# Patient Record
Sex: Female | Born: 1955
Health system: Southern US, Community
[De-identification: ages and names within clinical notes are randomized; demographics above are authoritative.]

## PROBLEM LIST (undated history)

## (undated) DIAGNOSIS — I10 Essential (primary) hypertension: Secondary | ICD-10-CM

## (undated) DIAGNOSIS — F32A Depression, unspecified: Secondary | ICD-10-CM

## (undated) DIAGNOSIS — F329 Major depressive disorder, single episode, unspecified: Secondary | ICD-10-CM

## (undated) DIAGNOSIS — G43909 Migraine, unspecified, not intractable, without status migrainosus: Secondary | ICD-10-CM

## (undated) DIAGNOSIS — G4700 Insomnia, unspecified: Secondary | ICD-10-CM

## (undated) DIAGNOSIS — R7303 Prediabetes: Secondary | ICD-10-CM

## (undated) DIAGNOSIS — L709 Acne, unspecified: Secondary | ICD-10-CM

## (undated) DIAGNOSIS — E538 Deficiency of other specified B group vitamins: Secondary | ICD-10-CM

## (undated) DIAGNOSIS — E559 Vitamin D deficiency, unspecified: Secondary | ICD-10-CM

## (undated) DIAGNOSIS — F429 Obsessive-compulsive disorder, unspecified: Secondary | ICD-10-CM

## (undated) DIAGNOSIS — E119 Type 2 diabetes mellitus without complications: Secondary | ICD-10-CM

## (undated) DIAGNOSIS — G3184 Mild cognitive impairment, so stated: Secondary | ICD-10-CM

## (undated) DIAGNOSIS — K219 Gastro-esophageal reflux disease without esophagitis: Secondary | ICD-10-CM

## (undated) DIAGNOSIS — F332 Major depressive disorder, recurrent severe without psychotic features: Secondary | ICD-10-CM

## (undated) DIAGNOSIS — E785 Hyperlipidemia, unspecified: Secondary | ICD-10-CM

## (undated) DIAGNOSIS — C4490 Unspecified malignant neoplasm of skin, unspecified: Secondary | ICD-10-CM

## (undated) DIAGNOSIS — M1990 Unspecified osteoarthritis, unspecified site: Secondary | ICD-10-CM

---

## 2006-01-26 ENCOUNTER — Ambulatory Visit: Payer: Self-pay | Admitting: Surgery

## 2006-02-19 ENCOUNTER — Ambulatory Visit: Payer: Self-pay

## 2006-02-25 ENCOUNTER — Ambulatory Visit: Payer: Self-pay

## 2007-04-06 ENCOUNTER — Ambulatory Visit: Payer: Self-pay | Admitting: Family Medicine

## 2009-02-14 ENCOUNTER — Ambulatory Visit: Payer: Self-pay

## 2010-04-30 ENCOUNTER — Ambulatory Visit: Payer: Self-pay

## 2011-07-30 ENCOUNTER — Ambulatory Visit: Payer: Self-pay | Admitting: Family Medicine

## 2011-12-24 ENCOUNTER — Emergency Department: Payer: Self-pay | Admitting: Unknown Physician Specialty

## 2012-03-22 ENCOUNTER — Emergency Department: Payer: Self-pay | Admitting: Emergency Medicine

## 2012-03-22 LAB — COMPREHENSIVE METABOLIC PANEL
Albumin: 3.9 g/dL (ref 3.4–5.0)
Alkaline Phosphatase: 100 U/L (ref 50–136)
BUN: 18 mg/dL (ref 7–18)
Bilirubin,Total: 0.2 mg/dL (ref 0.2–1.0)
EGFR (African American): 53 — ABNORMAL LOW
SGPT (ALT): 57 U/L

## 2012-03-22 LAB — CBC
HCT: 44.1 % (ref 35.0–47.0)
HGB: 14.2 g/dL (ref 12.0–16.0)
MCV: 91 fL (ref 80–100)
Platelet: 226 10*3/uL (ref 150–440)

## 2012-03-22 LAB — URINALYSIS, COMPLETE
Bacteria: NONE SEEN
Bilirubin,UR: NEGATIVE
Blood: NEGATIVE
Ph: 6 (ref 4.5–8.0)
Protein: NEGATIVE
RBC,UR: <1 /HPF (ref 0–5)
Specific Gravity: 1.003 (ref 1.003–1.030)
Squamous Epithelial: NONE SEEN

## 2013-01-26 ENCOUNTER — Ambulatory Visit: Payer: Self-pay

## 2013-01-31 ENCOUNTER — Ambulatory Visit: Payer: Self-pay

## 2014-01-26 ENCOUNTER — Ambulatory Visit: Payer: Self-pay | Admitting: Gastroenterology

## 2014-02-01 ENCOUNTER — Ambulatory Visit: Payer: Self-pay | Admitting: Family Medicine

## 2014-05-26 LAB — COMPREHENSIVE METABOLIC PANEL
ALBUMIN: 4.2 g/dL (ref 3.4–5.0)
ANION GAP: 14 (ref 7–16)
Alkaline Phosphatase: 67 U/L
BILIRUBIN TOTAL: 0.8 mg/dL (ref 0.2–1.0)
BUN: 16 mg/dL (ref 7–18)
Calcium, Total: 8.9 mg/dL (ref 8.5–10.1)
Chloride: 100 mmol/L (ref 98–107)
Co2: 23 mmol/L (ref 21–32)
Creatinine: 1.36 mg/dL — ABNORMAL HIGH (ref 0.60–1.30)
EGFR (African American): 49 — ABNORMAL LOW
EGFR (Non-African Amer.): 42 — ABNORMAL LOW
GLUCOSE: 189 mg/dL — AB (ref 65–99)
OSMOLALITY: 280 (ref 275–301)
Potassium: 2.8 mmol/L — ABNORMAL LOW (ref 3.5–5.1)
SGOT(AST): 43 U/L — ABNORMAL HIGH (ref 15–37)
SGPT (ALT): 45 U/L
Sodium: 137 mmol/L (ref 136–145)
TOTAL PROTEIN: 7.9 g/dL (ref 6.4–8.2)

## 2014-05-26 LAB — SALICYLATE LEVEL

## 2014-05-26 LAB — URINALYSIS, COMPLETE
BILIRUBIN, UR: NEGATIVE
BLOOD: NEGATIVE
Glucose,UR: NEGATIVE mg/dL (ref 0–75)
Ketone: NEGATIVE
Nitrite: NEGATIVE
PH: 6 (ref 4.5–8.0)
Protein: NEGATIVE
SPECIFIC GRAVITY: 1.008 (ref 1.003–1.030)

## 2014-05-26 LAB — DRUG SCREEN, URINE
Amphetamines, Ur Screen: NEGATIVE (ref ?–1000)
Barbiturates, Ur Screen: NEGATIVE (ref ?–200)
Benzodiazepine, Ur Scrn: NEGATIVE (ref ?–200)
CANNABINOID 50 NG, UR ~~LOC~~: NEGATIVE (ref ?–50)
Cocaine Metabolite,Ur ~~LOC~~: NEGATIVE (ref ?–300)
MDMA (Ecstasy)Ur Screen: POSITIVE (ref ?–500)
Methadone, Ur Screen: NEGATIVE (ref ?–300)
OPIATE, UR SCREEN: NEGATIVE (ref ?–300)
Phencyclidine (PCP) Ur S: NEGATIVE (ref ?–25)
Tricyclic, Ur Screen: NEGATIVE (ref ?–1000)

## 2014-05-26 LAB — CBC
HCT: 40.9 % (ref 35.0–47.0)
HGB: 13.4 g/dL (ref 12.0–16.0)
MCH: 30.4 pg (ref 26.0–34.0)
MCHC: 32.8 g/dL (ref 32.0–36.0)
MCV: 93 fL (ref 80–100)
Platelet: 279 10*3/uL (ref 150–440)
RBC: 4.41 10*6/uL (ref 3.80–5.20)
RDW: 13.6 % (ref 11.5–14.5)
WBC: 12.2 10*3/uL — AB (ref 3.6–11.0)

## 2014-05-26 LAB — ACETAMINOPHEN LEVEL: Acetaminophen: <2 ug/mL

## 2014-05-26 LAB — ETHANOL
Ethanol %: <0.003 % (ref 0.000–0.080)
Ethanol: <3 mg/dL

## 2014-05-27 ENCOUNTER — Inpatient Hospital Stay: Payer: Self-pay | Admitting: Psychiatry

## 2015-02-03 NOTE — H&P (Signed)
PATIENT NAME:  Kelsey Arroyo, Kelsey Arroyo MR#:  [id number] DATE OF BIRTH:  03-31-1956  DATE OF ADMISSION:  05/27/2014  PLACE OF DICTATION:  [place], [place], [place].  SEX:  Female.  RACE:  White.  AGE:  59.  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  The patient is divorced wife and currently lives with her mother who is in her 59s and sister.  All 3 of them live in a house.  The patient comes for 1st inpatient hospitalization psychiatry at [facility].  CHIEF COMPLAINT:  "Recently, I've been overwhelmed and not able to think clearly and not able to do stuff."  HISTORY OF PRESENT ILLNESS:  The patient reports that her [place] and she wants to help her, so she has been picking up her grandson and taking him here and there and this got too much on her and she could not deal with it and she started talking about how she feels and her niece and fiance thought that she should come here to hospital for help so that she can talk to somebody and get help as needed.  PAST PSYCHIATRIC HISTORY:  No previous history of inpatient hospital psychiatry.  No history of suicidal attempt, not being followed by any psychiatrist.  FAMILY HISTORY MENTAL ILLNESS:  No known mental illness.  No known no history of suicides in the family.  FAMILY HISTORY:  Raised by mother and family members.  Father was not in the picture as he was killed when the patient was very young.  Has several siblings and tries to be close to them.  PERSONAL HISTORY:  Born in [place], graduated from high school.  No college.    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  MILITARY HISTORY:  None.  MARRIAGES:  Married twice and divorced twice and has 2 sets of twins born 60 years apart, very close to  children who came to visit her today.  ALCOHOL AND DRUGS:  Denies drinking alcohol.  Denies street or prescription drug abuse or IV drug abuse.  Denies smoking nicotine cigarettes.  PAST MEDICAL HISTORY:  Has labile hypertension.  No known diabetes mellitus or high cholesterol.  No history of motor vehicle accident, never been unconscious.  Had colonoscopy done recently being followed by Dr. Gennie Alma, at [facility] outpatient.  Last appointment some time ago.  Next appointment is to be made.  PHYSICAL EXAMINATION: VITAL SIGNS:  Temperature is 98.4, pulse of 80 per minute, regular, respirations 18 per minute regular, blood pressure 138/78 mmHg. HEENT:  Head is normocephalic, atraumatic.  Eyes PERRLA. Fundi benign. NECK:  Supple without any organomegaly.. CHEST:  Normal expansion, normal breath sounds heard. HEART:  Normal S1 without any murmurs or gallops. ABDOMEN:  Soft.  No organomegaly.  Bowel sounds heard. RECTAL/PELVIC: Deferred. NEUROLOGIC:  Gait is normal.  Romberg is negative.  Cranial nerves II-XII grossly intact.  DTRs 2+ normal.    MENTAL STATUS EXAMINATION:  The patient is dressed in hospital scrubs alert and oriented to place, person and time, fully aware of the situation and brought her here for admission.  Affect is flat.  Mood restricted.  Admits feeling stressed out and anxious and overwhelmed dealing with daily chores and having to do the same.  No psychosis, except that she feels  paranoid about some people around, as she does not live in a neighborhood that is very safe and people are not very well to be trusted.  Memory is intact. Cognition is intact.  General fund of knowledge of information is fair.  She could count money.  Memory and recall are good.  Could remember all the 3 objects asked to remember over several minutes.  Knew [place] of [place], name of the current president.  Denies suicidal or homicidal plans and wants to get help and wants  to get better, though at the time of admission, she had wishes of suicide.  Insight and judgment guarded.  Impulse control is poor.  IMPRESSION:   AXIS I:  Major depressive disorder with suicidal ideas, but contracts for safety. AXIS II:  Deferred. AXIS III:  High cholesterol status post colonoscopy. AXIS IV:  Moderate, being overwhelmed and stressed out with doing lots of chores for family members and herself. AXIS V:  Global assessment function 30.  PLAN:  The patient admitted to [facility] for close observation and management for help.  She will be started back on Paxil and the dose will be adjusted.  She will be started on trazodone to help her rest at night.  During the stay in the hospital, she will be given milieu therapy and supportive counseling.  She will take part in individual group therapy where coping skills and dealing with stresses of life will be addressed.  At the time of discharge, the patient stabilized.  Appropriate followup appointment will be made in the community and she will be given counseling on an outpatient basis so that she can cope an ventilate her feelings.    ____________________________ Wallace Cullens. Franchot Mimes, MD skc:DT D: 05/27/2014 18:55:03 ET T: 05/27/2014 19:13:19 ET JOB#: [id number]  cc: Arlyn Leak K. Franchot Mimes, MD, <Dictator> Dewain Penning MD ELECTRONICALLY SIGNED 05/28/2014 16:08

## 2015-02-03 NOTE — Discharge Summary (Signed)
PATIENT NAME:  Kelsey Arroyo, Kelsey Arroyo MR#:  [id number] DATE OF BIRTH:  12/26/1955  DATE OF ADMISSION:  05/27/2014 DATE OF DISCHARGE:  05/29/2014  REASON FOR ADMISSION: Worsening depression related to relational problems with family members.   DISCHARGE DIAGNOSES:  AXIS I: Major depressive disorder, recurrent, moderate.  AXIS II: None.  AXIS III: Hypertension.  AXIS IV: Conflict with mother and daughter.  AXIS V: GAF 50.  DISCHARGE MEDICATIONS: (NO CHANGES FROM HOME MEDICATIONS)  Paxil 60 mg p.o. daily for depression, trazodone 150 mg p.o. at bedtime for insomnia, Ativan 1 mg b.i.d. as needed for anxiety, simvastatin 10 mg p.o. daily for hypercholesterolemia, etodolac 500 mg b.i.d. for hypertension, hydrochloride/triamterene 25 mg/37.5 mg one tab p.o. daily for hypertension, Zantac 150 mg p.o. b.i.d. for GERD.  HOSPITAL COURSE: [name] presented to the Emergency Department willingly on August 15th requesting psychiatric evaluation. The patient reported a past history of depression for which she was prescribed Paxil 60 mg and trazodone 150 mg at bedtime by her primary care provider. The patient wanted help because she has been feeling overwhelmed with her current living arrangement and relationship with her daughter. The patient has been living for years with her mother who is 64 years ago and her sister. She feels unwanted and unappreciated there. She  describes her mother as hypercritical and mean at times. The patient also reports having trouble with one of her daughters as she usually takes care of her grandchildren and great-grandchildren. She states that frequently her daughter feels that she does not do things right and criticizes her frequently.  The patient was admitted to behavioral health for further stabilization and assessment as the patient wanted to be hospitalized due to worsening depression. The patient was maintained on her current medications. No changes were made. The patient was  initially placed on suicide precautions and was observed. Throughout her stay she did well. She denied any suicidality, psychoses. She did not require any seclusion or restraints. This was a short and uneventful hospitalization for this 59 year old female. The patient was advised to follow up with a therapist outpatient and to continue with her current medications as they have been effective for many years.   MENTAL STATUS EXAMINATION: At the time of the discharge, the patient was alert and oriented to person, place, time and situation. She was cooperative. Psychomotor activity was within normal range. Eye contact was good and in normal range. Speech had regular tone, volume, and rate. Thought process was linear and goal directed. Thought content was negative for suicidality or homicidality. Perception was negative for psychosis. Mood euthymic. Affect reactive. Insight and judgment fair.  LABORATORY RESULTS: BUN 16, creatinine 1.36, sodium 137, potassium 2.8. Alcohol was below detection limit. AST 43, ALT 45. Urine toxicology screen was positive for MDMA; however, this is likely to be a false positive. WBC 12.2, hemoglobin 13.4, hematocrit 40.9, platelet count 279,000. UA was clear.   DISCHARGE DISPOSITION: The patient will be returned to her home with her family; however, the patient hopes that in the near future she will be able to move out to her own apartment.   DISCHARGE FOLLOWUP: The patient has been given instructions to follow up with therapist.  List with providers in her network were given.    ____________________________ Hildred Priest, MD ahg:sb D: 05/29/2014 14:17:38 ET T: 05/29/2014 17:04:58 ET JOB#: [id number]  cc: Hildred Priest, MD, <Dictator> Rhodia Albright MD ELECTRONICALLY SIGNED 05/30/2014 14:53

## 2015-06-22 ENCOUNTER — Telehealth: Payer: Self-pay

## 2015-06-22 NOTE — Telephone Encounter (Signed)
Patient lost for appt.  At 10 am this morning.  Called volunteer car to drive to [facility] clinic .  Unable to reach anyone via phone .   Verbal and written directions with contact number given to patient.

## 2016-04-05 ENCOUNTER — Emergency Department: Payer: 59

## 2016-04-05 ENCOUNTER — Emergency Department
Admission: EM | Admit: 2016-04-05 | Discharge: 2016-04-06 | Disposition: A | Discharge status: Home / Self Care | Payer: 59 | Attending: Emergency Medicine | Admitting: Emergency Medicine

## 2016-04-05 ENCOUNTER — Other Ambulatory Visit: Payer: Self-pay

## 2016-04-05 ENCOUNTER — Encounter: Payer: Self-pay | Admitting: *Deleted

## 2016-04-05 DIAGNOSIS — E785 Hyperlipidemia, unspecified: Secondary | ICD-10-CM | POA: Insufficient documentation

## 2016-04-05 DIAGNOSIS — F329 Major depressive disorder, single episode, unspecified: Secondary | ICD-10-CM | POA: Diagnosis not present

## 2016-04-05 DIAGNOSIS — R270 Ataxia, unspecified: Secondary | ICD-10-CM

## 2016-04-05 DIAGNOSIS — E876 Hypokalemia: Secondary | ICD-10-CM | POA: Insufficient documentation

## 2016-04-05 DIAGNOSIS — N390 Urinary tract infection, site not specified: Secondary | ICD-10-CM | POA: Diagnosis not present

## 2016-04-05 DIAGNOSIS — Z85828 Personal history of other malignant neoplasm of skin: Secondary | ICD-10-CM | POA: Diagnosis not present

## 2016-04-05 HISTORY — DX: Unspecified malignant neoplasm of skin, unspecified: C44.90

## 2016-04-05 HISTORY — DX: Hyperlipidemia, unspecified: E78.5

## 2016-04-05 HISTORY — DX: Major depressive disorder, single episode, unspecified: F32.9

## 2016-04-05 HISTORY — DX: Depression, unspecified: F32.A

## 2016-04-05 LAB — BASIC METABOLIC PANEL
Anion gap: 13 (ref 5–15)
BUN: 16 mg/dL (ref 6–20)
CALCIUM: 9.4 mg/dL (ref 8.9–10.3)
CO2: 25 mmol/L (ref 22–32)
CREATININE: 1.64 mg/dL — AB (ref 0.44–1.00)
Chloride: 99 mmol/L — ABNORMAL LOW (ref 101–111)
GFR, EST AFRICAN AMERICAN: 38 mL/min — AB (ref >60)
GFR, EST NON AFRICAN AMERICAN: 33 mL/min — AB (ref >60)
Glucose, Bld: 154 mg/dL — ABNORMAL HIGH (ref 65–99)
Potassium: 2.8 mmol/L — CL (ref 3.5–5.1)
SODIUM: 137 mmol/L (ref 135–145)

## 2016-04-05 LAB — URINALYSIS COMPLETE WITH MICROSCOPIC (ARMC ONLY)
Bilirubin Urine: NEGATIVE
Glucose, UA: NEGATIVE mg/dL
Hgb urine dipstick: NEGATIVE
Ketones, ur: NEGATIVE mg/dL
Nitrite: NEGATIVE
PROTEIN: NEGATIVE mg/dL
SPECIFIC GRAVITY, URINE: 1.01 (ref 1.005–1.030)
pH: 6 (ref 5.0–8.0)

## 2016-04-05 LAB — CBC WITH DIFFERENTIAL/PLATELET
BASOS PCT: 1 %
Basophils Absolute: 0.1 10*3/uL (ref 0–0.1)
EOS ABS: 0.2 10*3/uL (ref 0–0.7)
Eosinophils Relative: 2 %
HCT: 44.8 % (ref 35.0–47.0)
Hemoglobin: 14.9 g/dL (ref 12.0–16.0)
LYMPHS ABS: 2.4 10*3/uL (ref 1.0–3.6)
Lymphocytes Relative: 26 %
MCH: 30 pg (ref 26.0–34.0)
MCHC: 33.4 g/dL (ref 32.0–36.0)
MCV: 89.8 fL (ref 80.0–100.0)
MONOS PCT: 8 %
Monocytes Absolute: 0.8 10*3/uL (ref 0.2–0.9)
NEUTROS ABS: 5.9 10*3/uL (ref 1.4–6.5)
NEUTROS PCT: 63 %
PLATELETS: 262 10*3/uL (ref 150–440)
RBC: 4.99 MIL/uL (ref 3.80–5.20)
RDW: 14.3 % (ref 11.5–14.5)
WBC: 9.3 10*3/uL (ref 3.6–11.0)

## 2016-04-05 LAB — TROPONIN I

## 2016-04-05 MED ORDER — NITROFURANTOIN MONOHYD MACRO 100 MG PO CAPS: 100.0000 mg | ORAL_CAPSULE | Freq: Once | ORAL | Status: DC

## 2016-04-05 MED ORDER — MECLIZINE HCL 25 MG PO TABS
25.0000 mg | ORAL_TABLET | Freq: Once | ORAL | Status: AC
  Administered 2016-04-05: 25 mg via ORAL
  Filled 2016-04-05: qty 1

## 2016-04-05 MED ORDER — CEPHALEXIN 500 MG PO CAPS: 500.0000 mg | ORAL_CAPSULE | Freq: Three times a day (TID) | ORAL | Status: AC

## 2016-04-05 MED ORDER — POTASSIUM CHLORIDE ER 10 MEQ PO TBCR: 40.0000 meq | EXTENDED_RELEASE_TABLET | Freq: Every day | ORAL | Status: DC

## 2016-04-05 MED ORDER — POTASSIUM CHLORIDE CRYS ER 20 MEQ PO TBCR
40.0000 meq | EXTENDED_RELEASE_TABLET | Freq: Once | ORAL | Status: AC
  Administered 2016-04-05: 40 meq via ORAL
  Filled 2016-04-05: qty 2

## 2016-04-05 MED ORDER — NITROFURANTOIN MONOHYD MACRO 100 MG PO CAPS: 100.0000 mg | ORAL_CAPSULE | Freq: Two times a day (BID) | ORAL | Status: DC

## 2016-04-05 MED ORDER — SODIUM CHLORIDE 0.9 % IV BOLUS (SEPSIS)
1000.0000 mL | Freq: Once | INTRAVENOUS | Status: AC
  Administered 2016-04-05: 1000 mL via INTRAVENOUS

## 2016-04-05 MED ORDER — POTASSIUM CHLORIDE ER 10 MEQ PO TBCR: 40.0000 meq | EXTENDED_RELEASE_TABLET | Freq: Once | ORAL | Status: DC

## 2016-04-05 MED ORDER — CEPHALEXIN 500 MG PO CAPS
500.0000 mg | ORAL_CAPSULE | Freq: Once | ORAL | Status: AC
  Administered 2016-04-06: 500 mg via ORAL
  Filled 2016-04-05: qty 1

## 2016-04-05 NOTE — ED Notes (Signed)
Pt. States she makes herself vomit sometimes when she thinks she is getting sick.  Pt. States she vomited one time this week, in which she did not force herself to vomit.

## 2016-04-05 NOTE — ED Notes (Signed)
Patient was brought to the hospital by co-workers due to confusion, dizziness and being off-balance. Co-workers stated that the patient couldn't find her car in the parking lot. Patient states she has been grieving for the loss of a friend and has been depressed for the last two weeks, and that she frequently can't find her car in the parking lot. Patient states that she has been intermittently dizzy and off-balance when ambulating, but has a history of vertigo. Patient states she lives with her mother who hates her and her sisters hate her. Patient is alert and oriented x4, clear speech, moves all extremities equally. Patient is currently in a wheelchair in triage, but states she is not dizzy at this time.

## 2016-04-05 NOTE — ED Provider Notes (Signed)
[facility] Emergency Department Provider Note   ____________________________________________  Time seen: Approximately [id number] PM  I have reviewed the triage vital signs and the nursing notes.   HISTORY  Chief Complaint Dizziness   HPI Kelsey Arroyo is a 60 y.o. female with a history of depression who is presenting to the emergency department with feeling unsteady on her feet as well as intermittent left hand numbness over the past week. She says that she has been stressed because of her home situation living with her mother who is she argues with frequently. Says that she also lost a family member recently she said was her friend. She denies any suicidal or homicidal ideation. Is not sure if the stress is contributing to her current symptoms. Says that she has been forgetful about finding her car but this is not a new thing for her. Has not taken anything for her vertigo or unsteadiness on her feet. Says that this is only an intermittent feeling and that she does not have any when she is lying down at this time. Does not have any ringing or roaring in her ears. No pressure to her's bilaterally. No numbness or weakness at this time. Denies any chest pain or shortness of breath.   Past Medical History  Diagnosis Date  . Depression   . Skin cancer   . Hyperlipemia     There are no active problems to display for this patient.   Past Surgical History  Procedure Laterality Date  . Cesarean section      x2    No current outpatient prescriptions on file.  Allergies Review of patient's allergies indicates no known allergies.  No family history on file.  Social History Social History  Substance Use Topics  . Smoking status: Never Smoker   . Smokeless tobacco: None  . Alcohol Use: No    Review of Systems Constitutional: No fever/chills Eyes: No visual changes. ENT: No sore throat. Cardiovascular: Denies chest pain. Respiratory: Denies shortness  of breath. Gastrointestinal: No abdominal pain.  No nausea, no vomiting.  No diarrhea.  No constipation. Genitourinary: Negative for dysuria. Musculoskeletal: Negative for back pain. Skin: Negative for rash. Neurological: Negative for headaches, focal weakness  10-point ROS otherwise negative.  ____________________________________________   PHYSICAL EXAM:  VITAL SIGNS: ED Triage Vitals  Enc Vitals Group     BP 04/05/16 1859 127/83 mmHg     Pulse Rate 04/05/16 1859 109     Resp 04/05/16 1859 18     Temp 04/05/16 1859 98 F (36.7 C)     Temp Source 04/05/16 1859 Oral     SpO2 04/05/16 1859 96 %     Weight 04/05/16 1859 145 lb (65.772 kg)     Height 04/05/16 1859 4\' 9"  (1.448 m)     Head Cir --      Peak Flow --      Pain Score --      Pain Loc --      Pain Edu? --      Excl. in [place]? --     Constitutional: Alert and oriented. Well appearing and in no acute distress. Eyes: Conjunctivae are normal. PERRL. EOMI.No nystagmus. ead: Atraumatic.Tympanic membranes are normal bilaterally. Nose: No congestion/rhinnorhea. Mouth/Throat: Mucous membranes are moist.  Oropharynx non-erythematous. Neck: No stridor.   Cardiovascular: Normal rate, regular rhythm. Grossly normal heart sounds.  Good peripheral circulation with equal and bilateral radial pulses. Respiratory: Normal respiratory effort.  No retractions. Lungs CTAB. Gastrointestinal:  Soft and nontender. No distention. No abdominal bruits. No CVA tenderness. Musculoskeletal: No lower extremity tenderness nor edema.  No joint effusions. Neurologic:  Normal speech and language. No gross focal neurologic deficits are appreciated. No ataxia on finger to nose testing. However, when the patient gets up and tries to walk she becomes unsteady on her feet. She is able to ambulate but with an ataxic gait. Skin:  Skin is warm, dry and intact. No rash noted. Psychiatric: Mood and affect are normal. Speech and behavior are  normal.  ____________________________________________   LABS (all labs ordered are listed, but only abnormal results are displayed)  Labs Reviewed  BASIC METABOLIC PANEL - Abnormal; Notable for the following:    Potassium 2.8 (*)    Chloride 99 (*)    Glucose, Bld 154 (*)    Creatinine, Ser 1.64 (*)    GFR calc non Af Amer 33 (*)    GFR calc Af Amer 38 (*)    All other components within normal limits  URINALYSIS COMPLETEWITH MICROSCOPIC ([facility] ONLY) - Abnormal; Notable for the following:    Color, Urine YELLOW (*)    APPearance CLOUDY (*)    Leukocytes, UA 3+ (*)    Bacteria, UA RARE (*)    Squamous Epithelial / LPF 0-5 (*)    All other components within normal limits  URINE CULTURE  CBC WITH DIFFERENTIAL/PLATELET  TROPONIN I   ____________________________________________  EKG  ED ECG REPORT I, Doran Stabler, the attending physician, personally viewed and interpreted this ECG.   Date: 04/05/2016  EKG Time: 2125  Rate: 94  Rhythm: normal sinus rhythm  Axis: Right axis deviation  Intervals:none  ST&T Change: No ST segment elevation or depression. Biphasic T waves to the precordial leads.  ____________________________________________  RADIOLOGY   DG Chest 2 View (Final result) Result time: 04/05/16 21:41:26   Final result by Rad Results In Interface (04/05/16 21:41:26)   Narrative:   CLINICAL DATA: Acute onset of dizziness and cough. Initial encounter.  EXAM: CHEST 2 VIEW  COMPARISON: None.  FINDINGS: The lungs are well-aerated. Peribronchial thickening is seen. There is no evidence of focal opacification, pleural effusion or pneumothorax.  The heart is normal in size; the mediastinal contour is within normal limits. No acute osseous abnormalities are seen.  IMPRESSION: Peribronchial thickening noted. Lungs otherwise grossly clear.   Electronically Signed By: Garald Balding M.D. On: 04/05/2016 21:41          CT Head Wo  Contrast (Final result) Result time: 04/05/16 [id number]   Final result by Rad Results In Interface (04/05/16 21:34:22)   Narrative:   CLINICAL DATA: 60 year old female with a history of mental distress  EXAM: CT HEAD WITHOUT CONTRAST  TECHNIQUE: Contiguous axial images were obtained from the base of the skull through the vertex without intravenous contrast.  COMPARISON: 03/22/2012  FINDINGS: Unremarkable appearance of the calvarium without acute fracture or aggressive lesion.  Unremarkable appearance of the scalp soft tissues.  Unremarkable appearance of the bilateral orbits.  Mastoid air cells are clear.  No significant paranasal sinus disease  No acute intracranial hemorrhage, midline shift, or mass effect.  Gray-white differentiation is maintained, without CT evidence of acute ischemia.  Unremarkable configuration of the ventricles.  Mild brain volume loss, similar to the comparison.  IMPRESSION: No CT evidence of acute intracranial abnormality.  Similar appearance of parenchymal atrophy.  Signed,  Dulcy Fanny. Earleen Newport, DO  [facility]  [facility]   Electronically Signed By:  Corrie Mckusick D.O. On: 04/05/2016 21:34    ____________________________________________   PROCEDURES   ____________________________________________   INITIAL IMPRESSION / ASSESSMENT AND PLAN / ED COURSE  Pertinent labs & imaging results that were available during my care of the patient were reviewed by me and considered in my medical decision making (see chart for details).  ----------------------------------------- 11:39 PM on 04/05/2016 -----------------------------------------  Patient is resting comfortably at this time. Able to ambulate with some improvement in her gait. Checked her heel to shin movements at this time and there is no ataxic movement. Able to walk unassisted walking in a straight line but difficulty  with getting in and out of bed. Unclear Cause of the patient's symptoms. We'll treat her hypokalemia as well as UTI. Patient is not experiencing any spinning sensation of the room and wasn't prior to arrival. Says that she just feels unsteady in her feet. Will not discharge with meclizine. She'll be following up with her primary care doctor for further evaluation. She does mention again that she has been taking the family lost particularly hard over the past 2 weeks. She says that she was given several references for counseling follow-up from her cardiologist which she plans to pursue. She continues to not report any suicidal or homicidal ideation. I feel she is safe for outpatient treatment at this time. We reviewed her labs as well as imaging and she understands plan and is willing to comply. ____________________________________________   FINAL CLINICAL IMPRESSION(S) / ED DIAGNOSES  Hypokalemia. Ataxia. Urinary tract infection.    NEW MEDICATIONS STARTED DURING THIS VISIT:  New Prescriptions   No medications on file     Note:  This document was prepared using Dragon voice recognition software and may include unintentional dictation errors.    Orbie Pyo, MD 04/05/16 [phone]

## 2016-04-05 NOTE — ED Notes (Signed)
URINE COLLECTED AND SENT  LM EDT

## 2016-04-05 NOTE — ED Notes (Signed)
Pt. Indicated that she has been having some family living arrangement problems.  Pt. States she has been feeling dizzy for the past week worse tonight.

## 2016-04-05 NOTE — ED Notes (Signed)
Lab called to report 2.8 K level, Dr. Archie Balboa advised.

## 2016-04-06 NOTE — ED Notes (Signed)
Pt. Going home with family. 

## 2016-04-07 LAB — URINE CULTURE

## 2016-06-11 ENCOUNTER — Ambulatory Visit
Admission: RE | Admit: 2016-06-11 | Discharge: 2016-06-11 | Disposition: A | Discharge status: Home / Self Care | Payer: Self-pay | Source: Ambulatory Visit | Attending: Oncology | Admitting: Oncology

## 2016-06-11 ENCOUNTER — Ambulatory Visit: Payer: 59 | Attending: Oncology

## 2016-06-11 VITALS — BP 114/77 | HR 85 | Temp 97.9°F | Ht 59.45 in | Wt 153.6 lb

## 2016-06-11 DIAGNOSIS — Z0000 Encounter for general adult medical examination without abnormal findings: Secondary | ICD-10-CM

## 2016-06-11 NOTE — Progress Notes (Signed)
Subjective:     Patient ID: Kelsey Arroyo, female   DOB: 1955-11-25, 60 y.o.   MRN: [id number]  HPI   Review of Systems     Objective:   Physical Exam  Pulmonary/Chest: Right breast exhibits no inverted nipple, no mass, no nipple discharge, no skin change and no tenderness. Left breast exhibits no inverted nipple, no mass, no nipple discharge, no skin change and no tenderness. Breasts are symmetrical.  Genitourinary: No labial fusion. There is no rash, tenderness, lesion or injury on the right labia. There is no rash, tenderness, lesion or injury on the left labia. No erythema, tenderness or bleeding in the vagina. No foreign body in the vagina. No signs of injury around the vagina. No vaginal discharge found.  Genitourinary Comments: Patient had hysterectomy.  No cervix visualized or palpated.  Unable to palpate ovaries.       Assessment:     60 year old patient presents for [facility] clinic visitPatient screened, and meets B[facility]CP eligibility.  Patient does not have insurance, Medicare or Medicaid.  Handout given on Affordable Care Act.Patient screened, and meets B[facility]CP eligibility.  Patient does not have insurance, Medicare or Medicaid.  Handout given on Affordable Care Act.CBE unremarkable.  Ptient has history of  Hysterectomy. Unsure if partial or total.  No cervix visualized.  Not able to palpate ovaries.  Last pap in 2014 was ASCUS HPV positive.     Plan:     Sent for bilateral screening mammogram.  Specimen collected for pap.

## 2016-06-17 LAB — PAP LB AND HPV HIGH-RISK
HPV, high-risk: POSITIVE — AB
PAP SMEAR COMMENT: 0

## 2016-06-18 NOTE — Progress Notes (Signed)
Phoned patient with normal mammogram results, and negative/positive papa results.  Explained to patient that she will have a repeat pap smear with her annual visit in 2018 due to positive HPV results.  Copy to [facility].

## 2016-06-25 NOTE — Progress Notes (Signed)
Sent message to [employer], who followed patient in 2014 for pap results of ASCUS/positive for follow-up pap recommendations.  Patient has a history of a hysterectomy with  bilateral salpingo/oopherectomy in 2002.

## 2016-06-27 NOTE — Progress Notes (Addendum)
Per Melody Shambley CNMW from [facility], patient is to have repeat pap on annual exam related to positive HPV result on pap performed 06/11/16.

## 2016-08-26 ENCOUNTER — Emergency Department: Payer: 59

## 2016-08-26 ENCOUNTER — Emergency Department
Admission: EM | Admit: 2016-08-26 | Discharge: 2016-08-26 | Disposition: A | Discharge status: Home / Self Care | Payer: 59 | Attending: Emergency Medicine | Admitting: Emergency Medicine

## 2016-08-26 DIAGNOSIS — Z85828 Personal history of other malignant neoplasm of skin: Secondary | ICD-10-CM | POA: Insufficient documentation

## 2016-08-26 DIAGNOSIS — W1809XA Striking against other object with subsequent fall, initial encounter: Secondary | ICD-10-CM | POA: Insufficient documentation

## 2016-08-26 DIAGNOSIS — Y999 Unspecified external cause status: Secondary | ICD-10-CM | POA: Diagnosis not present

## 2016-08-26 DIAGNOSIS — Y929 Unspecified place or not applicable: Secondary | ICD-10-CM | POA: Insufficient documentation

## 2016-08-26 DIAGNOSIS — S0081XA Abrasion of other part of head, initial encounter: Secondary | ICD-10-CM

## 2016-08-26 DIAGNOSIS — S0990XA Unspecified injury of head, initial encounter: Secondary | ICD-10-CM | POA: Diagnosis present

## 2016-08-26 DIAGNOSIS — Z23 Encounter for immunization: Secondary | ICD-10-CM | POA: Diagnosis not present

## 2016-08-26 DIAGNOSIS — I10 Essential (primary) hypertension: Secondary | ICD-10-CM | POA: Diagnosis not present

## 2016-08-26 DIAGNOSIS — S0083XA Contusion of other part of head, initial encounter: Secondary | ICD-10-CM | POA: Insufficient documentation

## 2016-08-26 DIAGNOSIS — Y9389 Activity, other specified: Secondary | ICD-10-CM | POA: Insufficient documentation

## 2016-08-26 DIAGNOSIS — W19XXXA Unspecified fall, initial encounter: Secondary | ICD-10-CM

## 2016-08-26 HISTORY — DX: Essential (primary) hypertension: I10

## 2016-08-26 LAB — BASIC METABOLIC PANEL
ANION GAP: 11 (ref 5–15)
BUN: 15 mg/dL (ref 6–20)
CALCIUM: 8.9 mg/dL (ref 8.9–10.3)
CO2: 24 mmol/L (ref 22–32)
Chloride: 102 mmol/L (ref 101–111)
Creatinine, Ser: 1.09 mg/dL — ABNORMAL HIGH (ref 0.44–1.00)
GFR calc Af Amer: >60 mL/min (ref >60)
GFR, EST NON AFRICAN AMERICAN: 53 mL/min — AB (ref >60)
GLUCOSE: 100 mg/dL — AB (ref 65–99)
Potassium: 3.6 mmol/L (ref 3.5–5.1)
SODIUM: 137 mmol/L (ref 135–145)

## 2016-08-26 LAB — CBC WITH DIFFERENTIAL/PLATELET
BASOS ABS: 0.1 10*3/uL (ref 0–0.1)
BASOS PCT: 1 %
EOS PCT: 5 %
Eosinophils Absolute: 0.3 10*3/uL (ref 0–0.7)
HEMATOCRIT: 43.2 % (ref 35.0–47.0)
Hemoglobin: 14.3 g/dL (ref 12.0–16.0)
Lymphocytes Relative: 31 %
Lymphs Abs: 2.2 10*3/uL (ref 1.0–3.6)
MCH: 30 pg (ref 26.0–34.0)
MCHC: 33.2 g/dL (ref 32.0–36.0)
MCV: 90.4 fL (ref 80.0–100.0)
MONO ABS: 0.5 10*3/uL (ref 0.2–0.9)
Monocytes Relative: 7 %
NEUTROS ABS: 4 10*3/uL (ref 1.4–6.5)
Neutrophils Relative %: 56 %
PLATELETS: 205 10*3/uL (ref 150–440)
RBC: 4.78 MIL/uL (ref 3.80–5.20)
RDW: 13.7 % (ref 11.5–14.5)
WBC: 7 10*3/uL (ref 3.6–11.0)

## 2016-08-26 LAB — URINALYSIS COMPLETE WITH MICROSCOPIC (ARMC ONLY)
BILIRUBIN URINE: NEGATIVE
Bacteria, UA: NONE SEEN
Glucose, UA: NEGATIVE mg/dL
HGB URINE DIPSTICK: NEGATIVE
KETONES UR: NEGATIVE mg/dL
LEUKOCYTES UA: NEGATIVE
NITRITE: NEGATIVE
PH: 7 (ref 5.0–8.0)
Protein, ur: NEGATIVE mg/dL
Specific Gravity, Urine: 1.008 (ref 1.005–1.030)

## 2016-08-26 LAB — TROPONIN I: Troponin I: <0.03 ng/mL (ref <0.03)

## 2016-08-26 MED ORDER — TETANUS-DIPHTH-ACELL PERTUSSIS 5-2.5-18.5 LF-MCG/0.5 IM SUSP
0.5000 mL | Freq: Once | INTRAMUSCULAR | Status: AC
Start: 2016-08-26 — End: 2016-08-26
  Administered 2016-08-26: 0.5 mL via INTRAMUSCULAR
  Filled 2016-08-26: qty 0.5

## 2016-08-26 NOTE — ED Notes (Signed)
Hooked pt up to dinamap

## 2016-08-26 NOTE — ED Notes (Signed)
Pt tearful and wanted RN to call mom to tell Olivia Mackie to pick up child at 2. RN notified.

## 2016-08-26 NOTE — ED Notes (Signed)
Per Dr Reita Cliche awaiting add on troponin level before the pt can be discharged

## 2016-08-26 NOTE — ED Triage Notes (Signed)
Pt arrived via ems for a fall today - pt was on a field trip with grandchild and when she went to find her car she became confused and disoriented - then she fell face first onto the ground causing a hematoma to forehead and abrasion to left knee - pt states that she has had periods of confusion off and on for months and a history of falls (3-4 falls in the last 3 months) - pt is A&O x4 at this time - respirations even and unlabored

## 2016-08-26 NOTE — ED Notes (Signed)
Patient transported to CT 

## 2016-08-26 NOTE — ED Notes (Signed)
Pt arrived via ems for a fall today - pt was on a field trip with grandchild and when she went to find her car she became confused and disoriented - then she fell face first onto the ground causing a hematoma to forehead and abrasion to left knee - pt states that she has had periods of confusion off and on for months and a history of falls (3-4 falls in the last 3 months) - pt is A&O x4 at this time - respirations even and unlabored - CBG 99

## 2016-08-26 NOTE — Discharge Instructions (Signed)
You were evaluated after fall, no serious injury is suspected.  Your examine evaluation are reassuring in the emergency department today.  Return to the emergency room for any new or worsening condition including any neurologic symptoms such as worsening headache, confusion, altered mental status, speech problems, dizziness or passing out, weakness or numbness, or any other symptoms concerning to you.

## 2016-08-26 NOTE — ED Provider Notes (Signed)
[facility] Emergency Department Provider Note ____________________________________________   I have reviewed the triage vital signs and the triage nursing note.  HISTORY  Chief Complaint Fall and Altered Mental Status   Historian Patient  HPI Kelsey Arroyo is a 60 y.o. female brought in by EMS after a fall. Patient states that she thinks that she just lost her balance and landed striking her head and forehead. She has a mild headache. Denies neck pain. Denies extremity injury other than a scrape to her left knee. She has been able to walk after the fall. She thinks that over the past 2 weeks she has had some mild increase in some baseline off balance issues. Denies focal weakness or numbness.  No chest pain or trouble breathing.  Off balance she considers mild to moderate, headache pain is mild.  Unclear when her last tetanus shot was.    Past Medical History:  Diagnosis Date  . Depression   . Hyperlipemia   . Hypertension   . Skin cancer     There are no active problems to display for this patient.   Past Surgical History:  Procedure Laterality Date  . CESAREAN SECTION     x2    Prior to Admission medications   Medication Sig Start Date End Date Taking? Authorizing Provider  potassium chloride (K-DUR) 10 MEQ tablet Take 4 tablets (40 mEq total) by mouth daily. 04/05/16   Orbie Pyo, MD    No Known Allergies  No family history on file.  Social History Social History  Substance Use Topics  . Smoking status: Never Smoker  . Smokeless tobacco: Never Used  . Alcohol use No    Review of Systems  Constitutional: Negative for fever. Eyes: Negative for visual changes. ENT: Negative for sore throat. Cardiovascular: Negative for chest pain. Respiratory: Negative for shortness of breath. Gastrointestinal: Negative for abdominal pain, vomiting and diarrhea. Genitourinary: Negative for dysuria. Musculoskeletal: Negative for  back pain. Skin: Negative for rash. Neurological: Positive for mild headache. 10 point Review of Systems otherwise negative ____________________________________________   PHYSICAL EXAM:  VITAL SIGNS: ED Triage Vitals  Enc Vitals Group     BP 08/26/16 1140 110/78     Pulse Rate 08/26/16 1140 70     Resp 08/26/16 1140 16     Temp 08/26/16 1140 97.7 F (36.5 C)     Temp src --      SpO2 08/26/16 1140 98 %     Weight 08/26/16 1142 150 lb (68 kg)     Height 08/26/16 1142 5' (1.524 m)     Head Circumference --      Peak Flow --      Pain Score 08/26/16 1142 0     Pain Loc --      Pain Edu? --      Excl. in [place]? --      Constitutional: Alert and oriented. Well appearing and in no distress. HEENT   Head: Normocephalic.  Ecchymosis and abrasions to her forehead, and nose and chin.      Eyes: Conjunctivae are normal. PERRL. Normal extraocular movements.      Ears:         Nose: No congestion/rhinnorhea. No nasal septal hematoma. No nasal bridge tenderness.   Mouth/Throat: Mucous membranes are mildly dry.  No chipped teeth.   Neck: No stridor.  No midline cervical spine tenderness to palpation or range of motion. Cardiovascular/Chest: Normal rate, regular rhythm.  No murmurs, rubs, or  gallops. Respiratory: Normal respiratory effort without tachypnea nor retractions. Breath sounds are clear and equal bilaterally. No wheezes/rales/rhonchi. Gastrointestinal: Soft. No distention, no guarding, no rebound. Nontender.    Genitourinary/rectal:Deferred Musculoskeletal: Nontender with normal range of motion in all extremities. No joint effusions.  No lower extremity tenderness.  No edema.  Tiny abrasion to the left knee. Neurologic:  Normal speech and language. No slurred speech. No aphasia. No gross or focal neurologic deficits are appreciated.  Coordination intact. Skin: Few abrasions to the face and left knee. Psychiatric: Mood and affect are normal. Speech and behavior are normal.  Patient exhibits appropriate insight and judgment.   ____________________________________________  LABS (pertinent positives/negatives)  Labs Reviewed  BASIC METABOLIC PANEL - Abnormal; Notable for the following:       Result Value   Glucose, Bld 100 (*)    Creatinine, Ser 1.09 (*)    GFR calc non Af Amer 53 (*)    All other components within normal limits  URINALYSIS COMPLETEWITH MICROSCOPIC ([facility] ONLY) - Abnormal; Notable for the following:    Color, Urine YELLOW (*)    APPearance CLEAR (*)    Squamous Epithelial / LPF 0-5 (*)    All other components within normal limits  CBC WITH DIFFERENTIAL/PLATELET  TROPONIN I    ____________________________________________   EKG I, Lisa Roca, MD, the attending physician have personally viewed and interpreted all ECGs.  70 bpm. Normal sinus rhythm. Narrow QRS. Normal axis. Nonspecific ST and T-wave ____________________________________________  RADIOLOGY All Xrays were viewed by me. Imaging interpreted by Radiologist.  Ct head without contrast:   CLINICAL DATA: Fall with head injury.  EXAM: CT HEAD WITHOUT CONTRAST  TECHNIQUE: Contiguous axial images were obtained from the base of the skull through the vertex without intravenous contrast.  COMPARISON: 04/05/2016 head CT.  FINDINGS: Brain: No evidence of parenchymal hemorrhage or extra-axial fluid collection. No mass lesion, mass effect, or midline shift. No CT evidence of acute infarction. Intracranial atherosclerosis. Nonspecific minimal subcortical and periventricular white matter hypodensity, most in keeping with chronic small vessel ischemic change. Mild generalized cerebral volume loss. No ventriculomegaly.  Vascular: No hyperdense vessel or unexpected calcification.  Skull: No evidence of calvarial fracture.  Sinuses/Orbits: The visualized paranasal sinuses are essentially clear.  Other: The mastoid air cells are unopacified.  IMPRESSION: 1. No  evidence of acute intracranial abnormality. No evidence of calvarial fracture. 2. Mild generalized cerebral volume loss and minimal chronic small vessel ischemia.      __________________________________________  PROCEDURES  Procedure(s) performed: None  Critical Care performed: None  ____________________________________________   ED COURSE / ASSESSMENT AND PLAN  Pertinent labs & imaging results that were available during my care of the patient were reviewed by me and considered in my medical decision making (see chart for details).   Laboratory examination reassuring for no indication of medical complication contributing to fall  - based on patient history she states that she just kind of lost her balance and thinks maybe as She hadn't had anything to eat this morning.  CT the head shows no traumatic findings, nor acute cause for her reporting some being off balance over the last week or so.  Tetanus status was updated.  [place] for discharge home and follow up with PCP.   CONSULTATIONS:  None  Patient / Family / Caregiver informed of clinical course, medical decision-making process, and agree with plan.   I discussed return precautions, follow-up instructions, and discharge instructions with patient and/or family.   ___________________________________________  FINAL CLINICAL IMPRESSION(S) / ED DIAGNOSES   Final diagnoses:  Injury of head, initial encounter  Facial abrasion, initial encounter  Fall, initial encounter              Note: This dictation was prepared with Dragon dictation. Any transcriptional errors that result from this process are unintentional    Lisa Roca, MD 08/26/16 [phone]

## 2016-10-15 ENCOUNTER — Encounter: Payer: Self-pay | Admitting: *Deleted

## 2016-10-15 ENCOUNTER — Emergency Department
Admission: EM | Admit: 2016-10-15 | Discharge: 2016-10-16 | Disposition: A | Discharge status: Other Acute Inpatient Hospital | Payer: 59 | Attending: Emergency Medicine | Admitting: Emergency Medicine

## 2016-10-15 DIAGNOSIS — F418 Other specified anxiety disorders: Secondary | ICD-10-CM | POA: Diagnosis not present

## 2016-10-15 DIAGNOSIS — F332 Major depressive disorder, recurrent severe without psychotic features: Secondary | ICD-10-CM | POA: Diagnosis not present

## 2016-10-15 DIAGNOSIS — Z79899 Other long term (current) drug therapy: Secondary | ICD-10-CM | POA: Diagnosis not present

## 2016-10-15 DIAGNOSIS — Z85828 Personal history of other malignant neoplasm of skin: Secondary | ICD-10-CM | POA: Insufficient documentation

## 2016-10-15 DIAGNOSIS — I10 Essential (primary) hypertension: Secondary | ICD-10-CM | POA: Diagnosis not present

## 2016-10-15 DIAGNOSIS — F32A Depression, unspecified: Secondary | ICD-10-CM

## 2016-10-15 DIAGNOSIS — F329 Major depressive disorder, single episode, unspecified: Secondary | ICD-10-CM

## 2016-10-15 DIAGNOSIS — F419 Anxiety disorder, unspecified: Secondary | ICD-10-CM

## 2016-10-15 LAB — URINALYSIS, ROUTINE W REFLEX MICROSCOPIC
BACTERIA UA: NONE SEEN
Bilirubin Urine: NEGATIVE
Glucose, UA: NEGATIVE mg/dL
HGB URINE DIPSTICK: NEGATIVE
KETONES UR: 80 mg/dL — AB
Nitrite: NEGATIVE
PROTEIN: 100 mg/dL — AB
Specific Gravity, Urine: 1.021 (ref 1.005–1.030)
pH: 5 (ref 5.0–8.0)

## 2016-10-15 LAB — ETHANOL: Alcohol, Ethyl (B): <5 mg/dL (ref <5)

## 2016-10-15 LAB — SALICYLATE LEVEL: Salicylate Lvl: <7 mg/dL (ref 2.8–30.0)

## 2016-10-15 LAB — BASIC METABOLIC PANEL
Anion gap: 14 (ref 5–15)
BUN: 18 mg/dL (ref 6–20)
CALCIUM: 9.9 mg/dL (ref 8.9–10.3)
CO2: 24 mmol/L (ref 22–32)
CREATININE: 1.29 mg/dL — AB (ref 0.44–1.00)
Chloride: 99 mmol/L — ABNORMAL LOW (ref 101–111)
GFR calc Af Amer: 50 mL/min — ABNORMAL LOW (ref >60)
GFR, EST NON AFRICAN AMERICAN: 43 mL/min — AB (ref >60)
GLUCOSE: 123 mg/dL — AB (ref 65–99)
Potassium: 3.4 mmol/L — ABNORMAL LOW (ref 3.5–5.1)
Sodium: 137 mmol/L (ref 135–145)

## 2016-10-15 LAB — CBC
HCT: 47.6 % — ABNORMAL HIGH (ref 35.0–47.0)
Hemoglobin: 15.7 g/dL (ref 12.0–16.0)
MCH: 29.5 pg (ref 26.0–34.0)
MCHC: 32.9 g/dL (ref 32.0–36.0)
MCV: 89.5 fL (ref 80.0–100.0)
PLATELETS: 335 10*3/uL (ref 150–440)
RBC: 5.32 MIL/uL — ABNORMAL HIGH (ref 3.80–5.20)
RDW: 14.4 % (ref 11.5–14.5)
WBC: 12.9 10*3/uL — AB (ref 3.6–11.0)

## 2016-10-15 LAB — ACETAMINOPHEN LEVEL

## 2016-10-15 MED ORDER — TRAZODONE HCL 100 MG PO TABS
100.0000 mg | ORAL_TABLET | Freq: Once | ORAL | Status: AC
  Administered 2016-10-15: 100 mg via ORAL

## 2016-10-15 MED ORDER — TRAZODONE HCL 100 MG PO TABS
ORAL_TABLET | ORAL | Status: AC
  Filled 2016-10-15: qty 1

## 2016-10-15 NOTE — ED Notes (Signed)
Lab called and explained that there was only enough urine sent for either a UDS or UA.  Told to complete UA and we will get another urine sample later for UDS.
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
## 2016-10-15 NOTE — ED Notes (Signed)
Meal tray given to patient.

## 2016-10-15 NOTE — ED Notes (Signed)
Per the patient she would only like her password to be given to her daughter Elease Etienne, and son Audelia Acton.  Password will be distributed by her daughter Denton Ar who dropped her off.  Patient's family was given all information pertaining to password use, visitation hours, and phone hours.

## 2016-10-15 NOTE — ED Notes (Signed)
[facility] RN updated about the need for second urine sample for UDS.

## 2016-10-15 NOTE — ED Notes (Signed)
Pt transported to the [facility] without any difficulty.
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
## 2016-10-15 NOTE — ED Notes (Signed)
Patient assigned to appropriate care area. Patient oriented to unit/care area: Informed that, for their safety, care areas are designed for safety and monitored by security cameras at all times; and visiting hours explained to patient. Patient verbalizes understanding, and verbal contract for safety obtained. 

## 2016-10-16 ENCOUNTER — Encounter: Payer: Self-pay | Admitting: *Deleted

## 2016-10-16 ENCOUNTER — Inpatient Hospital Stay
Admission: EM | Admit: 2016-10-16 | Discharge: 2016-10-30 | DRG: 885 | Disposition: A | Discharge status: Home / Self Care | Payer: No Typology Code available for payment source | Source: Intra-hospital | Attending: Psychiatry | Admitting: Psychiatry

## 2016-10-16 DIAGNOSIS — Z639 Problem related to primary support group, unspecified: Secondary | ICD-10-CM

## 2016-10-16 DIAGNOSIS — Z23 Encounter for immunization: Secondary | ICD-10-CM

## 2016-10-16 DIAGNOSIS — I10 Essential (primary) hypertension: Secondary | ICD-10-CM | POA: Diagnosis present

## 2016-10-16 DIAGNOSIS — G4700 Insomnia, unspecified: Secondary | ICD-10-CM | POA: Diagnosis present

## 2016-10-16 DIAGNOSIS — F332 Major depressive disorder, recurrent severe without psychotic features: Secondary | ICD-10-CM | POA: Diagnosis present

## 2016-10-16 DIAGNOSIS — F411 Generalized anxiety disorder: Secondary | ICD-10-CM | POA: Diagnosis present

## 2016-10-16 DIAGNOSIS — G3184 Mild cognitive impairment, so stated: Secondary | ICD-10-CM | POA: Diagnosis present

## 2016-10-16 DIAGNOSIS — F4010 Social phobia, unspecified: Secondary | ICD-10-CM | POA: Diagnosis present

## 2016-10-16 DIAGNOSIS — Z79899 Other long term (current) drug therapy: Secondary | ICD-10-CM

## 2016-10-16 DIAGNOSIS — E785 Hyperlipidemia, unspecified: Secondary | ICD-10-CM | POA: Diagnosis present

## 2016-10-16 DIAGNOSIS — F429 Obsessive-compulsive disorder, unspecified: Secondary | ICD-10-CM | POA: Diagnosis not present

## 2016-10-16 DIAGNOSIS — E538 Deficiency of other specified B group vitamins: Secondary | ICD-10-CM | POA: Diagnosis present

## 2016-10-16 DIAGNOSIS — F0391 Unspecified dementia with behavioral disturbance: Secondary | ICD-10-CM | POA: Diagnosis present

## 2016-10-16 DIAGNOSIS — N390 Urinary tract infection, site not specified: Secondary | ICD-10-CM | POA: Diagnosis present

## 2016-10-16 DIAGNOSIS — F1310 Sedative, hypnotic or anxiolytic abuse, uncomplicated: Secondary | ICD-10-CM | POA: Diagnosis present

## 2016-10-16 DIAGNOSIS — Z85828 Personal history of other malignant neoplasm of skin: Secondary | ICD-10-CM | POA: Diagnosis not present

## 2016-10-16 DIAGNOSIS — R4689 Other symptoms and signs involving appearance and behavior: Secondary | ICD-10-CM

## 2016-10-16 DIAGNOSIS — F418 Other specified anxiety disorders: Secondary | ICD-10-CM | POA: Diagnosis not present

## 2016-10-16 DIAGNOSIS — R4189 Other symptoms and signs involving cognitive functions and awareness: Secondary | ICD-10-CM

## 2016-10-16 DIAGNOSIS — R45851 Suicidal ideations: Secondary | ICD-10-CM | POA: Diagnosis present

## 2016-10-16 LAB — URINE DRUG SCREEN, QUALITATIVE (ARMC ONLY)
Amphetamines, Ur Screen: NOT DETECTED
BARBITURATES, UR SCREEN: NOT DETECTED
BENZODIAZEPINE, UR SCRN: POSITIVE — AB
Cannabinoid 50 Ng, Ur ~~LOC~~: NOT DETECTED
Cocaine Metabolite,Ur ~~LOC~~: NOT DETECTED
MDMA (Ecstasy)Ur Screen: NOT DETECTED
Methadone Scn, Ur: NOT DETECTED
Opiate, Ur Screen: NOT DETECTED
Phencyclidine (PCP) Ur S: NOT DETECTED
TRICYCLIC, UR SCREEN: NOT DETECTED

## 2016-10-16 MED ORDER — TRIAMTERENE-HCTZ 37.5-25 MG PO TABS
1.0000 | ORAL_TABLET | Freq: Every day | ORAL | Status: DC
  Administered 2016-10-16: 1 via ORAL
  Filled 2016-10-16: qty 1

## 2016-10-16 MED ORDER — SIMVASTATIN 10 MG PO TABS
10.0000 mg | ORAL_TABLET | Freq: Every day | ORAL | Status: DC
  Administered 2016-10-16: 10 mg via ORAL
  Filled 2016-10-16: qty 1

## 2016-10-16 MED ORDER — INFLUENZA VAC SPLIT QUAD 0.5 ML IM SUSY
0.5000 mL | PREFILLED_SYRINGE | INTRAMUSCULAR | Status: AC
  Administered 2016-10-27: 0.5 mL via INTRAMUSCULAR
  Filled 2016-10-16: qty 0.5

## 2016-10-16 MED ORDER — HYDROXYZINE HCL 25 MG PO TABS
25.0000 mg | ORAL_TABLET | Freq: Three times a day (TID) | ORAL | Status: DC | PRN
  Administered 2016-10-16 – 2016-10-18 (×2): 25 mg via ORAL
  Filled 2016-10-16 (×2): qty 1

## 2016-10-16 MED ORDER — MAGNESIUM HYDROXIDE 400 MG/5ML PO SUSP: 30.0000 mL | Freq: Every day | ORAL | Status: DC | PRN

## 2016-10-16 MED ORDER — ACETAMINOPHEN 325 MG PO TABS
650.0000 mg | ORAL_TABLET | Freq: Four times a day (QID) | ORAL | Status: DC | PRN
  Administered 2016-10-17: 650 mg via ORAL
  Filled 2016-10-16: qty 2

## 2016-10-16 MED ORDER — ALUM & MAG HYDROXIDE-SIMETH 200-200-20 MG/5ML PO SUSP: 30.0000 mL | ORAL | Status: DC | PRN

## 2016-10-16 MED ORDER — LORAZEPAM 1 MG PO TABS
1.0000 mg | ORAL_TABLET | Freq: Once | ORAL | Status: AC
  Administered 2016-10-16: 1 mg via ORAL

## 2016-10-16 MED ORDER — PAROXETINE HCL 30 MG PO TABS
60.0000 mg | ORAL_TABLET | Freq: Every day | ORAL | Status: DC
  Administered 2016-10-17 – 2016-10-20 (×4): 60 mg via ORAL
  Filled 2016-10-16 (×4): qty 2

## 2016-10-16 MED ORDER — LORAZEPAM 1 MG PO TABS
ORAL_TABLET | ORAL | Status: AC
  Filled 2016-10-16: qty 1

## 2016-10-16 MED ORDER — PAROXETINE HCL 20 MG PO TABS
60.0000 mg | ORAL_TABLET | Freq: Every day | ORAL | Status: DC
  Administered 2016-10-16: 60 mg via ORAL
  Filled 2016-10-16: qty 3

## 2016-10-16 MED ORDER — LORAZEPAM 2 MG PO TABS: 2.0000 mg | ORAL_TABLET | Freq: Once | ORAL | Status: DC

## 2016-10-16 MED ORDER — SIMVASTATIN 10 MG PO TABS
10.0000 mg | ORAL_TABLET | Freq: Every day | ORAL | Status: DC
  Administered 2016-10-16 – 2016-10-30 (×15): 10 mg via ORAL
  Filled 2016-10-16 (×15): qty 1

## 2016-10-16 MED ORDER — TRIAMTERENE-HCTZ 37.5-25 MG PO TABS
1.0000 | ORAL_TABLET | Freq: Every day | ORAL | Status: DC
  Administered 2016-10-17 – 2016-10-30 (×14): 1 via ORAL
  Filled 2016-10-16 (×14): qty 1

## 2016-10-16 MED ORDER — TRAZODONE HCL 50 MG PO TABS
150.0000 mg | ORAL_TABLET | Freq: Every day | ORAL | Status: DC
  Administered 2016-10-16 – 2016-10-17 (×2): 150 mg via ORAL
  Filled 2016-10-16 (×2): qty 1

## 2016-10-16 MED ORDER — TRAZODONE HCL 50 MG PO TABS: 150.0000 mg | ORAL_TABLET | Freq: Every day | ORAL | Status: DC

## 2016-10-16 NOTE — Tx Team (Signed)
Initial Treatment Plan 10/16/2016 6:22 PM Kelsey Arroyo [id number]    PATIENT STRESSORS: Health problems Medication change or noncompliance   PATIENT STRENGTHS: Active sense of humor Average or above average intelligence Capable of independent living Communication skills Motivation for treatment/growth Work skills   PATIENT IDENTIFIED PROBLEMS: Major Depression  Anxiety                   DISCHARGE CRITERIA:  Improved stabilization in mood, thinking, and/or behavior  PRELIMINARY DISCHARGE PLAN: Outpatient therapy  PATIENT/FAMILY INVOLVEMENT: This treatment plan has been presented to and reviewed with the patient, Kelsey Arroyo, and/or family member, .  The patient and family have been given the opportunity to ask questions and make suggestions.  Floyde Parkins, RN 10/16/2016, 6:22 PM
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
## 2016-10-16 NOTE — ED Notes (Signed)
Pt ambulated to the bathroom to void. Pt returned to her room without difficulty.

## 2016-10-16 NOTE — ED Notes (Signed)
Correction bp was entered incorrectly it was 139/72 pulse 84

## 2016-10-16 NOTE — ED Notes (Signed)
Ready to transport unit notified

## 2016-10-16 NOTE — BH Assessment (Signed)
Writer called and left a HIPPA Compliant message with daughter [phone]), requesting a return phone call. In need of collateral information.

## 2016-10-16 NOTE — Progress Notes (Signed)
D: Patient is anxious and hyperverbal. She also appears somewhat confused. Denies SI/HI/AVH at this time. Very tangential. Visible in the milieu.  A: Medication given with education. Encouragement provided. PRN Vistaril given.  R: Patient was compliant with medication. She has remained calm and cooperative. Safety maintained with 15 min checks.

## 2016-10-16 NOTE — Progress Notes (Signed)
Patient ask her Nurse for a Chaplain because she needed a prayer. Patient shared with Chaplain how her health and family relationships have been major stressors in her life. 

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
## 2016-10-16 NOTE — ED Notes (Signed)
Pt signed voluntary

## 2016-10-16 NOTE — Plan of Care (Signed)
Problem: Safety: Goal: Ability to remain free from injury will improve Outcome: Progressing Patient has remained free from injury during this shift.   
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
## 2016-10-16 NOTE — Progress Notes (Signed)
Patient admitted to unit. Anxious, hyper verbal. Reports stressor is grandson is autistic and pt believes his mother is not mis treating him. Pt also states she had run out of medications and had not been taking them. Pt denies SI, but endorses anxiety at 10. Patient tearful during assessment when speaking of grandson. Pt reports wants to get better so she can help take care of him. Pt denies HI, and AVH. Denies any drug or alcohol use. 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 Pt reports being seen at [place] this week.  Skin and contraband search completed and witnessed by Silva Bandy, Therapist, sports. No contraband found. Pt has red left eye, report she had procedure there, as well as a scab to right outer lower leg, reports where doctor removed skin cancer. Oriented patient to room and unit. Fluid and nutrition offered. Safety checks maintained. Pt remains safe on unit.

## 2016-10-16 NOTE — ED Notes (Signed)
Pt has been very anxious all morning and is worried about sleeping because she feels she will die in her sleep,

## 2016-10-16 NOTE — ED Notes (Signed)
Pt informed of needing uds

## 2016-10-16 NOTE — Progress Notes (Signed)
Patient is to be admitted to [place] by Dr. Weber Cooks.  Attending Physician will be Dr. Bary Leriche.   Patient has been assigned to room 301, by [place].   ER staff is aware of the admission ( ER Sect.; Dr.ER MD; Patient's Nurse &  Patient Access). Zianne Schubring K. Nash Shearer, LPC-A, [facility]  Counselor 10/16/2016 3:19 PM

## 2016-10-17 DIAGNOSIS — F429 Obsessive-compulsive disorder, unspecified: Secondary | ICD-10-CM | POA: Diagnosis present

## 2016-10-17 DIAGNOSIS — F332 Major depressive disorder, recurrent severe without psychotic features: Principal | ICD-10-CM

## 2016-10-17 DIAGNOSIS — N390 Urinary tract infection, site not specified: Secondary | ICD-10-CM | POA: Diagnosis present

## 2016-10-17 LAB — LIPID PANEL
CHOL/HDL RATIO: 3.4 ratio
Cholesterol: 166 mg/dL (ref 0–200)
HDL: 49 mg/dL (ref >40)
LDL CALC: 94 mg/dL (ref 0–99)
Triglycerides: 117 mg/dL (ref <150)
VLDL: 23 mg/dL (ref 0–40)

## 2016-10-17 LAB — TSH: TSH: 3.015 u[IU]/mL (ref 0.350–4.500)

## 2016-10-17 MED ORDER — FLUVOXAMINE MALEATE 50 MG PO TABS
50.0000 mg | ORAL_TABLET | Freq: Every day | ORAL | Status: DC
  Administered 2016-10-17 – 2016-10-19 (×3): 50 mg via ORAL
  Filled 2016-10-17 (×3): qty 1

## 2016-10-17 MED ORDER — RISPERIDONE 1 MG PO TABS
2.0000 mg | ORAL_TABLET | Freq: Every day | ORAL | Status: DC
  Administered 2016-10-17 – 2016-10-22 (×6): 2 mg via ORAL
  Filled 2016-10-17 (×6): qty 2

## 2016-10-17 MED ORDER — FOSFOMYCIN TROMETHAMINE 3 G PO PACK
3.0000 g | PACK | Freq: Once | ORAL | Status: AC
  Administered 2016-10-17: 3 g via ORAL
  Filled 2016-10-17: qty 3

## 2016-10-17 NOTE — Progress Notes (Signed)
Patient pleasant and cooperative this shift. Denies SI, HI, AVH. Endorses depression and anxiety. Medication and group compliant. Appropriate with staff and peers. Pt continues to be hyperverbal throughout shift, but redirectable.  Encouragement and support offered. Medications given as prescribed. Safety checks maintained.  Pt remains safe on unit with q 15 min checks.

## 2016-10-17 NOTE — Plan of Care (Signed)
Problem: Safety: Goal: Periods of time without injury will increase Outcome: Progressing Pt has remained free from injury
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
## 2016-10-17 NOTE — Plan of Care (Signed)
Problem: Health Behavior/Discharge Planning: Goal: Ability to manage health-related needs will improve Outcome: Progressing Pt medication and group compliant. Performing ADLs

## 2016-10-17 NOTE — Progress Notes (Signed)
Recreation Therapy Notes  Date: 01.05.17 Time: 9:30 am Location: Craft Room  Group Topic: Coping Skills  Goal Area(s) Addresses:  Patient will participate in healthy coping skill. Patient will verbalize benefit of using art as a coping skill.  Behavioral Response: Attentive  Intervention: Coloring  Activity: Patients were given coloring sheets to color and were instructed to think about what emotions they were feeling and what their minds were focused on.  Education: LRT educated patients on healthy coping skills.  Education Outcome: In group clarification offered  Clinical Observations/Feedback: Patient colored coloring sheet at first then decided to listen to music. Patient did not contribute to group discussion.  Leonette Monarch, LRT/CTRS 10/17/2016 10:28 AM

## 2016-10-17 NOTE — Progress Notes (Signed)
D: Patient is anxious and hyperverbal. She also appears somewhat confused. Denies SI/HI/AVH at this time. Tangential. Visible in the milieu.  A: Medication given with education. Encouragement provided. q15 minute safety checks maintained. R: Patient was compliant with medication. She has remained calm and cooperative. Safety maintained with 15 min checks. Voices no additional concerns at this time.

## 2016-10-17 NOTE — BHH Suicide Risk Assessment (Signed)
[facility] Admission Suicide Risk Assessment   Nursing information obtained from:  Patient Demographic factors:  Caucasian Current Mental Status:  NA Loss Factors:  NA Historical Factors:  NA Risk Reduction Factors:  Living with another person, especially a relative, Sense of responsibility to family  Total Time spent with patient: 1 hour Principal Problem: <principal problem not specified> Diagnosis:   Patient Active Problem List   Diagnosis Date Noted  . UTI (urinary tract infection) [N39.0] 10/17/2016  . OCD (obsessive compulsive disorder) [F42.9] 10/17/2016  . Severe recurrent major depression without psychotic features ([place]) [F33.2] 10/16/2016  . Hypertension [I10] 10/16/2016   Subjective Data: suicidal ideation.  Continued Clinical Symptoms:  Alcohol Use Disorder Identification Test Final Score (AUDIT): 0 The "Alcohol Use Disorders Identification Test", Guidelines for Use in Primary Care, Second Edition.  World Pharmacologist [facility]). Score between 0-7:  no or low risk or alcohol related problems. Score between 8-15:  moderate risk of alcohol related problems. Score between 16-19:  high risk of alcohol related problems. Score 20 or above:  warrants further diagnostic evaluation for alcohol dependence and treatment.   CLINICAL FACTORS:   Severe Anxiety and/or Agitation Depression:   Impulsivity   Musculoskeletal: Strength & Muscle Tone: within normal limits Gait & Station: normal Patient leans: N/A  Psychiatric Specialty Exam: Physical Exam  Nursing note and vitals reviewed.   Review of Systems  Musculoskeletal: Positive for joint pain.  Psychiatric/Behavioral: Positive for depression, memory loss and suicidal ideas. The patient is nervous/anxious.   All other systems reviewed and are negative.   Blood pressure 135/68, pulse 88, temperature 98.3 F (36.8 C), resp. rate 18, height 4\' 9"  (1.448 m), weight 67.1 kg (148 lb), SpO2 100 %.Body mass index is 32.03 kg/m.   General Appearance: Casual  Eye Contact:  Good  Speech:  Clear and Coherent  Volume:  Normal  Mood:  Anxious  Affect:  Labile and Tearful  Thought Process:  Disorganized and Descriptions of Associations: Tangential  Orientation:  Full (Time, Place, and Person)  Thought Content:  Paranoid Ideation  Suicidal Thoughts:  No  Homicidal Thoughts:  No  Memory:  Immediate;   Poor Recent;   Poor Remote;   Poor  Judgement:  Impaired  Insight:  Shallow  Psychomotor Activity:  Normal  Concentration:  Concentration: Poor and Attention Span: Poor  Recall:  Poor  Fund of Knowledge:  Fair  Language:  Fair  Akathisia:  No  Handed:  Right  AIMS (if indicated):     Assets:  Communication Skills Desire for Improvement Financial Resources/Insurance Housing Physical Health Resilience Social Support Transportation Vocational/Educational  ADL's:  Intact  Cognition:  WNL  Sleep:  Number of Hours: 7.5      COGNITIVE FEATURES THAT CONTRIBUTE TO RISK:  None    SUICIDE RISK:   Moderate:  Frequent suicidal ideation with limited intensity, and duration, some specificity in terms of plans, no associated intent, good self-control, limited dysphoria/symptomatology, some risk factors present, and identifiable protective factors, including available and accessible social support.   PLAN OF CARE: Hospital admission, medication management, discharge planning.  Ms. Sivertsen is a 61 year old female with a history of depression admitted after presumed suicide attempt, that the patient denies, confusion and thought disorganization.   1. Suicidal ideation. The patient adamantly denies any thoughts, intentions, or plans to hurt herself or others.   2. Mood. She is on Paxil 60 mg.  3. Anxiety. Vistaril is available.  4. Insomnia. She is on Trazodone.  5.  HTN. She is on Maxzide.  6. Dyslipidemia. She is on Zocor.  7. Family conflict. Family meeting would be helpful.  8. Disposition. The patient  will be discharged to home. She will follow up with her PCP. She needs psychiatry appointment.   I certify that inpatient services furnished can reasonably be expected to improve the patient's condition.  Orson Slick, MD 10/17/2016, 11:00 AM

## 2016-10-17 NOTE — BHH Group Notes (Signed)
[facility] LCSW Group Therapy   10/17/2016  1 PM  Type of Therapy: Group Therapy   Participation Level: Did Not Attend. Patient invited to participate but declined.    Alphonse Guild. Barba Solt, MSW, LCSWA, LCAS
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
## 2016-10-18 DIAGNOSIS — F429 Obsessive-compulsive disorder, unspecified: Secondary | ICD-10-CM

## 2016-10-18 LAB — HEMOGLOBIN A1C
Hgb A1c MFr Bld: 6 % — ABNORMAL HIGH (ref 4.8–5.6)
Mean Plasma Glucose: 126 mg/dL

## 2016-10-18 MED ORDER — HYDROXYZINE HCL 50 MG PO TABS
50.0000 mg | ORAL_TABLET | Freq: Three times a day (TID) | ORAL | Status: DC | PRN
  Administered 2016-10-18 – 2016-10-25 (×9): 50 mg via ORAL
  Filled 2016-10-18 (×11): qty 1

## 2016-10-18 MED ORDER — TRAZODONE HCL 100 MG PO TABS
200.0000 mg | ORAL_TABLET | Freq: Every day | ORAL | Status: DC
  Administered 2016-10-18 – 2016-10-19 (×2): 200 mg via ORAL
  Filled 2016-10-18: qty 2

## 2016-10-18 NOTE — Progress Notes (Signed)
Patient verbalized this morning that she could not sleep good last night since many family issues bothering her.Vistaril given for anxiety and helped her.Denies suicidal or homicidal ideations and AV hallucinations.Appropriate with staff & peers.Compliant with medications.Attended groups.Appetite & energy level good.Support & encouragement given.

## 2016-10-18 NOTE — [facility] Group Notes (Signed)
Jan 6th 2018 100 PM  Type of Therapy:  Group Therapy  Participation Level:  Active  Participation Quality:  Attentive  Affect:  Appropriate  Cognitive:  Appropriate  Insight:  Engaged  Engagement in Therapy:  Developing/Improving  Modes of Intervention:  Activity, Clarification, Discussion, Education, Exploration and Socialization   The purpose of this group is to assist patients in learning to regulate negative emotions and experience positive emotions.  Patients will be guided to discuss ways in which they have been vulnerable to their negative emotions.  These vulnerabilities will be juxtaposed with experiences of positive emotions or situations, and  patients challenged to use positive emotions to combat negative ones.  Special emphasis will be placed on coping with negative emotions in conflict situations,  and patients will process healthy conflict resolution skills.    Therapeutic Goals:   1. Patient will identify two positive emotions or experiences to reflect on in order to balance out negative emotions:    2. Patient will label two or more emotions that they find the most difficult to experience:    3. Patient will be able to demonstrate positive conflict resolution skills through discussion or role plays:      Summary of Progress/Problems: The topic for today was Emotional Regulation Peers were asked to reflect on 2 incidents when they lives were unbalanced.  With facilitation from  group Leader those situations were reflected by patient and their peers.   Pt discussed coping skills that can be used for regulating emotions This patient was able to be supportive and this patients feelings was supported by this worker and peers Patient were then to review ways to improve thier emotions and to share options that assisted them in reducing thier own emotional dysregulation. This patient was able to understand and follow group and share with others ideas (  taking medications, keep doctors appointmnets, create a daily routine,excercize, volunteer, call friend/family and several stress reduction techniques were reviewed.)    Kelsey Arroyo, Akashdeep Chuba M Jan 6th.2018

## 2016-10-18 NOTE — Progress Notes (Addendum)
Patient ID: Kelsey Arroyo, female   DOB: August 01, 1956, 61 y.o.   MRN: [id number] Patient Identification: Kelsey Arroyo MRN:  [id number] Date of Evaluation:  10/17/2016 Chief Complaint:  major depression disorder Principal Diagnosis: <principal problem not specified> Diagnosis:       Patient Active Problem List   Diagnosis Date Noted  . UTI (urinary tract infection) [N39.0] 10/17/2016  . OCD (obsessive compulsive disorder) [F42.9] 10/17/2016  . Severe recurrent major depression without psychotic features ([place]) [F33.2] 10/16/2016  . Hypertension [I10] 10/16/2016      Kelsey Arroyo is a 61 year old female with a history of depression and severe OCD. Pt seen, chart reviewed, Pt reports anxiety and poor sleep. Pt labile, ruminates about family stressors. Denies SI/HI.   Psychiatric Specialty Exam: Physical Exam  Nursing note and vitals reviewed.   Review of Systems  Musculoskeletal: Positive for joint pain.  Psychiatric/Behavioral: Positive for depression, memory loss and suicidal ideas. The patient is nervous/anxious.   All other systems reviewed and are negative.   Blood pressure 135/68, pulse 88, temperature 98.3 F (36.8 C), resp. rate 18, height 4\' 9"  (1.448 m), weight 67.1 kg (148 lb), SpO2 100 %.Body mass index is 32.03 kg/m.  General Appearance: Casual  Eye Contact:  Good  Speech:  Clear and Coherent  Volume:  Normal  Mood:  Anxious  Affect:  Labile and Tearful  Thought Process:  Disorganized and Descriptions of Associations: Tangential  Orientation:  Full (Time, Place, and Person)  Thought Content:  Paranoid Ideation  Suicidal Thoughts:  No  Homicidal Thoughts:  No  Memory:  Immediate;   Poor Recent;   Poor Remote;   Poor  Judgement:  Impaired  Insight:  Shallow  Psychomotor Activity:  Normal  Concentration:  Concentration: Poor and Attention Span: Poor  Recall:  Poor  Fund of Knowledge:  Fair  Language:  Fair  Akathisia:  No  Handed:  Right  AIMS (if  indicated):     Assets:  Communication Skills Desire for Improvement Financial Resources/Insurance Housing Physical Health Resilience Social Support Transportation Vocational/Educational  ADL's:  Intact  Cognition:  WNL  Sleep:  Number of Hours: 7.5    Treatment Plan Summary: Daily contact with patient to assess and evaluate symptoms and progress in treatment and Medication management   Kelsey Arroyo is a 61 year old female with a history of depression admitted after presumed suicide attempt, that the patient denies, confusion and thought disorganization.   1. Suicidal ideation. The patient adamantly denies any thoughts, intentions, or plans to hurt herself or others.   2. Mood. She is on Paxil 60 mg. I will add Risperdal and try Luvox at night for OCD.  3. Anxiety. Increase prn Vistaril   4. Insomnia. Increase  Trazodone.  5. HTN. She is on Maxzide.  6. Dyslipidemia. She is on Zocor.  7. Family conflict. Family meeting would be helpful.  8. Cognitive decline. We will do cognitive testing when the patient feels better.   9. UTI. Urine culture collected. We will give fosfomycin.   10. Disposition. The patient will be discharged to home. She will follow up with her PCP. She needs psychiatry appointment.     Observation Level/Precautions:  15 minute checks  Laboratory:  CBC Chemistry Profile UDS UA  Psychotherapy:    Medications:    Consultations:    Discharge Concerns:    Estimated LOS:  Other:     Physician Treatment Plan for Primary Diagnosis: <principal problem not specified> Long Term Goal(s): Improvement in  symptoms so as ready for discharge  Short Term Goals: Ability to identify changes in lifestyle to reduce recurrence of condition will improve, Ability to verbalize feelings will improve, Ability to disclose and discuss suicidal ideas, Ability to demonstrate self-control will improve, Ability to identify and develop effective coping behaviors  will improve, Ability to maintain clinical measurements within normal limits will improve, Compliance with prescribed medications will improve and Ability to identify triggers associated with substance abuse/mental health issues will improve  Physician Treatment Plan for Secondary Diagnosis: Active Problems:   Severe recurrent major depression without psychotic features (HCC)   Hypertension   UTI (urinary tract infection)   OCD (obsessive compulsive disorder)  Long Term Goal(s): NA  Short Term Goals: NA  I certify that inpatient services furnished can reasonably be expected to improve the patient's condition.

## 2016-10-18 NOTE — Plan of Care (Signed)
Problem: Coping: Goal: Ability to identify and develop effective coping behavior will improve Outcome: Not Progressing Verbalized that could not sleep well because of anxiety.

## 2016-10-19 MED ORDER — CEPHALEXIN 250 MG PO CAPS
250.0000 mg | ORAL_CAPSULE | Freq: Three times a day (TID) | ORAL | Status: AC
  Administered 2016-10-19 – 2016-10-26 (×21): 250 mg via ORAL
  Filled 2016-10-19 (×22): qty 1

## 2016-10-19 MED ORDER — TRAZODONE HCL 50 MG PO TABS
ORAL_TABLET | ORAL | Status: AC
  Filled 2016-10-19: qty 4

## 2016-10-19 NOTE — [facility] Suicide Risk Assessment (Signed)
[place] INPATIENT:  Family/Significant Other Suicide Prevention Education  Suicide Prevention Education:  Education Completed; Kelsey Arroyo (daughter [phone]), has been identified by the patient as the family member/significant other with whom the patient will be residing, and identified as the person(s) who will aid the patient in the event of a mental health crisis (suicidal ideations/suicide attempt).  With written consent from the patient, the family member/significant other has been provided the following suicide prevention education, prior to the and/or following the discharge of the patient.  The suicide prevention education provided includes the following:  Suicide risk factors  Suicide prevention and interventions  National Suicide Hotline telephone number  [facility] assessment telephone number  [facility] Emergency Assistance [place] and/or Residential Mobile Crisis Unit telephone number  Request made of family/significant other to:  Remove weapons (e.g., guns, rifles, knives), all items previously/currently identified as safety concern. No access to guns or weapons.    Remove drugs/medications (over-the-counter, prescriptions, illicit drugs), all items previously/currently identified as a safety concern.  The family member/significant other verbalizes understanding of the suicide prevention education information provided.  The family member/significant other agrees to remove the items of safety concern listed above.  Kelsey Arroyo, [place] 10/19/2016, 10:42 AM

## 2016-10-19 NOTE — [facility] Group Notes (Signed)
[place] Group Notes:  (Nursing/MHT/Case Management/Adjunct)  Date:  10/19/2016  Time:  4:49 AM  Type of Therapy:  Psychoeducational Skills  Participation Level:  Active  Participation Quality:  Appropriate  Affect:  Appropriate  Cognitive:  Appropriate  Insight:  Appropriate  Engagement in Group:  Engaged  Modes of Intervention:  Discussion, Socialization and Support  Summary of Progress/Problems:  Kelsey Arroyo 10/19/2016, 4:49 AM

## 2016-10-19 NOTE — Progress Notes (Signed)
Patient ID: Kelsey Arroyo, female   DOB: 12-31-1955, 61 y.o.   MRN: [id number] Patient Identification: Kelsey Arroyo MRN:  [id number] Date of Evaluation:  10/17/2016 Chief Complaint:  major depression disorder Principal Diagnosis: <principal problem not specified> Diagnosis:       Patient Active Problem List   Diagnosis Date Noted  . UTI (urinary tract infection) [N39.0] 10/17/2016  . OCD (obsessive compulsive disorder) [F42.9] 10/17/2016  . Severe recurrent major depression without psychotic features ([place]) [F33.2] 10/16/2016  . Hypertension [I10] 10/16/2016      Kelsey Arroyo is a 61 year old female with a history of depression and severe OCD. Pt seen, chart reviewed, discussed with nursing staff. Pt continue to report anxiety, foul smeling urine, had ABx for UTI, urine c/s pending.  Slept well.  Pt labile, ruminates about family stressors. Denies SI/HI.   Psychiatric Specialty Exam: Physical Exam  Nursing note and vitals reviewed.   Review of Systems  Musculoskeletal: Positive for joint pain.  Psychiatric/Behavioral: Positive for depression, memory loss and suicidal ideas. The patient is nervous/anxious.   All other systems reviewed and are negative.   Blood pressure 135/68, pulse 88, temperature 98.3 F (36.8 C), resp. rate 18, height 4\' 9"  (1.448 m), weight 67.1 kg (148 lb), SpO2 100 %.Body mass index is 32.03 kg/m.  General Appearance: Casual  Eye Contact:  Good  Speech:  Clear and Coherent  Volume:  Normal  Mood:  Anxious  Affect:  Labile and Tearful  Thought Process:  Disorganized and Descriptions of Associations: Tangential  Orientation:  Full (Time, Place, and Person)  Thought Content:  Paranoid Ideation  Suicidal Thoughts:  No  Homicidal Thoughts:  No  Memory:  Immediate;   Poor Recent;   Poor Remote;   Poor  Judgement:  Impaired  Insight:  Shallow  Psychomotor Activity:  Normal  Concentration:  Concentration: Poor and Attention Span: Poor  Recall:  Poor   Fund of Knowledge:  Fair  Language:  Fair  Akathisia:  No  Handed:  Right  AIMS (if indicated):     Assets:  Communication Skills Desire for Improvement Financial Resources/Insurance Housing Physical Health Resilience Social Support Transportation Vocational/Educational  ADL's:  Intact  Cognition:  WNL  Sleep:  Number of Hours: 7.5    Treatment Plan Summary: Daily contact with patient to assess and evaluate symptoms and progress in treatment and Medication management   Kelsey Arroyo is a 61 year old female with a history of depression admitted after presumed suicide attempt, that the patient denies, confusion and thought disorganization.   1. Suicidal ideation. The patient adamantly denies any thoughts, intentions, or plans to hurt herself or others.   2. Mood. She is on Paxil 60 mg.  Risperdal and try Luvox at night for OCD.  3. Anxiety. Increase prn Vistaril   4. Insomnia. Increase  Trazodone.  5. HTN. She is on Maxzide.  6. Dyslipidemia. She is on Zocor.  7. Family conflict. Family meeting would be helpful.  8. Cognitive decline. We will do cognitive testing when the patient feels better.   9. UTI. Urine culture pending. 10. Disposition. The patient will be discharged to home. She will follow up with her PCP. She needs psychiatry appointment.     Observation Level/Precautions:  15 minute checks  Laboratory:  CBC Chemistry Profile UDS UA  Psychotherapy:    Medications:    Consultations:    Discharge Concerns:    Estimated LOS:  Other:     Physician Treatment Plan for Primary Diagnosis: <  principal problem not specified> Long Term Goal(s): Improvement in symptoms so as ready for discharge  Short Term Goals: Ability to identify changes in lifestyle to reduce recurrence of condition will improve, Ability to verbalize feelings will improve, Ability to disclose and discuss suicidal ideas, Ability to demonstrate self-control will improve, Ability  to identify and develop effective coping behaviors will improve, Ability to maintain clinical measurements within normal limits will improve, Compliance with prescribed medications will improve and Ability to identify triggers associated with substance abuse/mental health issues will improve  Physician Treatment Plan for Secondary Diagnosis: Active Problems:   Severe recurrent major depression without psychotic features (HCC)   Hypertension   UTI (urinary tract infection)   OCD (obsessive compulsive disorder)  Long Term Goal(s): NA  Short Term Goals: NA  I certify that inpatient services furnished can reasonably be expected to improve the patient's condition.   Patient ID: Kelsey Arroyo, female   DOB: 03-Mar-1956, 61 y.o.   MRN: [id number]

## 2016-10-19 NOTE — [facility] Group Notes (Signed)
  [place] LCSW Group Therapy  10/19/2016 2:35 PM  Type of Therapy:  Group Therapy Balance in Life  Participation Level:  Active  Participation Quality:  Appropriate  Affect:  Appropriate  Cognitive:  Alert and Oriented  Insight:  Developing/Improving  Engagement in Therapy:  Engaged  Modes of Intervention:  Activity, Clarification, Discussion, Education, Problem-solving and Rapport Building  Summary of Progress  The topic for group was balance in life. Today's group focused on defining balance in one's own words, identifying things that can knock one off balance, and exploring healthy ways to maintain balance in life. Group members were asked to provide an example of a time when they felt off balance, describe how they handled that situation, and process healthier ways to regain balance in the future. Group members were asked to share the most important tool for maintaining balance that they learned while at [facility] and how they plan to apply this method after discharge. This patient was able to express thoughts and feelings and support their peers.    Pauletta Browns, [name] 10/19/2016, 2:35 PM   Kelsey Arroyo

## 2016-10-19 NOTE — Progress Notes (Signed)
Patient with sad affect, cooperative behavior with meals, meds and plan of care. No SI/HI at this time. Quiet with peers. Denies s/s of UTI at this time, states only has "bad odor". Urine culture results pending. PO fluids encouraged. Patient states she "stays confused" all the time. Minimal interaction with peers. Appropriate when staff initiates interaction. Safety maintained.

## 2016-10-19 NOTE — Progress Notes (Addendum)
Pt denies SI/HI/AVH. Remains confused and gets easily disoriented on unti requiring direction back to her room. Attended evening group. Cooperative with medications. UA obtained and sent to lab. Denies pain. Endorses anxiety related to "not remembering". Remains safe on unit. Voices on additional concerns at this time. Will continue to monitor.  Pt up at 0200 confused and stating " I am just a nervous wreck". Appears Pt forgot she had already given urine specimen earlier in shift. Pt fell asleep before she could be given anit-anxiety medication.

## 2016-10-20 LAB — URINE CULTURE

## 2016-10-20 MED ORDER — TEMAZEPAM 15 MG PO CAPS
15.0000 mg | ORAL_CAPSULE | Freq: Every day | ORAL | Status: DC
  Administered 2016-10-20 – 2016-10-22 (×3): 15 mg via ORAL
  Filled 2016-10-20 (×3): qty 1

## 2016-10-20 MED ORDER — FLUVOXAMINE MALEATE 50 MG PO TABS
100.0000 mg | ORAL_TABLET | Freq: Every day | ORAL | Status: DC
  Administered 2016-10-20 – 2016-10-23 (×4): 100 mg via ORAL
  Filled 2016-10-20 (×4): qty 2

## 2016-10-20 MED ORDER — PAROXETINE HCL 20 MG PO TABS
40.0000 mg | ORAL_TABLET | Freq: Every day | ORAL | Status: DC
  Administered 2016-10-21 – 2016-10-24 (×4): 40 mg via ORAL
  Filled 2016-10-20 (×4): qty 2

## 2016-10-20 NOTE — Progress Notes (Signed)
Patient continues to deny having SI/HI; or of having any AVH; exhibits with noted confusion with stating, "I stay confused." Pleasant affect; received PM&AM medications without difficulty. Paient voice having some anxiety and received PRN medication; slept throughout the night uneventfully.

## 2016-10-20 NOTE — Plan of Care (Signed)
Problem: Activity: Goal: Sleeping patterns will improve Outcome: Progressing Sleep hours improving

## 2016-10-20 NOTE — Progress Notes (Signed)
[facility] MD Progress Note  10/20/2016 9:42 AM Kelsey Arroyo  MRN:  [id number]  Subjective: Kelsey Arroyo, reports no improvement since admission. She still feels very anxious and confused. She denies suicidal or homicidal ideation but her mood is depressed and affect complete. She accepts medications and tolerates them well. Sleep has improved with trazodone but she is now on 3 SSRIs running a risk of serotoninergic syndrome.   Per nursing: Patient continues to deny having SI/HI; or of having any AVH; exhibits with noted confusion with stating, "I stay confused." Pleasant affect; received PM&AM medications without difficulty. Paient voice having some anxiety and received PRN medication; slept throughout the night uneventfully.  Principal Problem: Severe recurrent major depression without psychotic features ([place]) Diagnosis:   Patient Active Problem List   Diagnosis Date Noted  . UTI (urinary tract infection) [N39.0] 10/17/2016  . OCD (obsessive compulsive disorder) [F42.9] 10/17/2016  . Severe recurrent major depression without psychotic features ([place]) [F33.2] 10/16/2016  . Hypertension [I10] 10/16/2016   Total Time spent with patient: 20 minutes  Past Psychiatric History: depression.  Past Medical History:  Past Medical History:  Diagnosis Date  . Depression   . Hyperlipemia   . Hypertension   . Skin cancer     Past Surgical History:  Procedure Laterality Date  . CESAREAN SECTION     x2   Family History: History reviewed. No pertinent family history. Family Psychiatric  History: See H&P. Social History:  History  Alcohol Use No     History  Drug Use No    Social History   Social History  . Marital status: Single    Spouse name: N/A  . Number of children: N/A  . Years of education: N/A   Social History Main Topics  . Smoking status: Never Smoker  . Smokeless tobacco: Never Used  . Alcohol use No  . Drug use: No  . Sexual activity: Not Asked   Other Topics Concern   . None   Social History Narrative  . None   Additional Social History:                         Sleep: Fair  Appetite:  Fair  Current Medications: Current Facility-Administered Medications  Medication Dose Route Frequency Provider Last Rate Last Dose  . acetaminophen (TYLENOL) tablet 650 mg  650 mg Oral Q6H PRN Gonzella Lex, MD   650 mg at 10/17/16 [id number]  . alum & mag hydroxide-simeth (MAALOX/MYLANTA) 200-200-20 MG/5ML suspension 30 mL  30 mL Oral Q4H PRN Gonzella Lex, MD      . cephALEXin (KEFLEX) capsule 250 mg  250 mg Oral Q8H Jalie Eiland B Margot Oriordan, MD   250 mg at 10/20/16 0534  . fluvoxaMINE (LUVOX) tablet 50 mg  50 mg Oral QHS Darden Flemister B Raygen Linquist, MD   50 mg at 10/19/16 2225  . hydrOXYzine (ATARAX/VISTARIL) tablet 50 mg  50 mg Oral TID PRN Lenward Chancellor, MD   50 mg at 10/19/16 1037  . Influenza vac split quadrivalent PF (FLUARIX) injection 0.5 mL  0.5 mL Intramuscular Tomorrow-1000 Jacque Byron B Naylah Cork, MD      . magnesium hydroxide (MILK OF MAGNESIA) suspension 30 mL  30 mL Oral Daily PRN Gonzella Lex, MD      . PARoxetine (PAXIL) tablet 60 mg  60 mg Oral Daily Gonzella Lex, MD   60 mg at 10/20/16 0813  . risperiDONE (RISPERDAL) tablet 2 mg  2 mg Oral  QHS Clovis Fredrickson, MD   2 mg at 10/19/16 2226  . simvastatin (ZOCOR) tablet 10 mg  10 mg Oral q1800 Gonzella Lex, MD   10 mg at 10/19/16 1611  . traZODone (DESYREL) tablet 200 mg  200 mg Oral QHS Lenward Chancellor, MD   200 mg at 10/19/16 2227  . triamterene-hydrochlorothiazide (MAXZIDE-25) 37.5-25 MG per tablet 1 tablet  1 tablet Oral Daily Gonzella Lex, MD   1 tablet at 10/20/16 0813    Lab Results: No results found for this or any previous visit (from the past 48 hour(s)).  Blood Alcohol level:  Lab Results  Component Value Date   ETH <5 123XX123    Metabolic Disorder Labs: Lab Results  Component Value Date   HGBA1C 6.0 (H) 10/17/2016   MPG 126 10/17/2016   No results found for:  PROLACTIN Lab Results  Component Value Date   CHOL 166 10/17/2016   TRIG 117 10/17/2016   HDL 49 10/17/2016   CHOLHDL 3.4 10/17/2016   VLDL 23 10/17/2016   LDLCALC 94 10/17/2016    Physical Findings: AIMS: Facial and Oral Movements Muscles of Facial Expression: None, normal Lips and Perioral Area: None, normal Jaw: None, normal Tongue: None, normal,Extremity Movements Upper (arms, wrists, hands, fingers): None, normal Lower (legs, knees, ankles, toes): None, normal, Trunk Movements Neck, shoulders, hips: None, normal, Overall Severity Severity of abnormal movements (highest score from questions above): None, normal Incapacitation due to abnormal movements: None, normal Patient's awareness of abnormal movements (rate only patient's report): No Awareness, Dental Status Current problems with teeth and/or dentures?: No Does patient usually wear dentures?: No  CIWA:    COWS:     Musculoskeletal: Strength & Muscle Tone: within normal limits Gait & Station: normal Patient leans: N/A  Psychiatric Specialty Exam: Physical Exam  Nursing note and vitals reviewed.   Review of Systems  Psychiatric/Behavioral: Positive for memory loss. The patient is nervous/anxious.   All other systems reviewed and are negative.   Blood pressure 122/87, pulse (!) 103, temperature 98.8 F (37.1 C), temperature source Oral, resp. rate 18, height 4\' 9"  (1.448 m), weight 67.1 kg (148 lb), SpO2 100 %.Body mass index is 32.03 kg/m.  General Appearance: Casual  Eye Contact:  Good  Speech:  Clear and Coherent  Volume:  Normal  Mood:  Anxious, Hopeless and Worthless  Affect:  Blunt  Thought Process:  Disorganized and Descriptions of Associations: Tangential  Orientation:  Full (Time, Place, and Person)  Thought Content:  Paranoid Ideation  Suicidal Thoughts:  No  Homicidal Thoughts:  No  Memory:  Immediate;   Fair Recent;   Fair Remote;   Fair  Judgement:  Impaired  Insight:  Lacking   Psychomotor Activity:  Decreased  Concentration:  Concentration: Poor and Attention Span: Poor  Recall:  Poor  Fund of Knowledge:  Fair  Language:  Fair  Akathisia:  No  Handed:  Right  AIMS (if indicated):     Assets:  Communication Skills Desire for Improvement Housing Physical Health Resilience Social Support Vocational/Educational  ADL's:  Intact  Cognition:  WNL  Sleep:  Number of Hours: 6.45     Treatment Plan Summary: Daily contact with patient to assess and evaluate symptoms and progress in treatment and Medication management   Kelsey Arroyo is a 61 year old female with a history of depression admitted after presumed suicide attempt, that the patient denies, confusion and thought disorganization.   1. Suicidal ideation. The patient adamantly denies any thoughts,  intentions, or plans to hurt herself or others.   2. Mood. She is on Paxil 60 mg, Risperdal 2 mg. We started Luvox at night for OCD. I will increase Paxil to 40 mg, increase Luvox to 100 and start lowering Trazodone as there is risk of serotonin syndrome.  3. Anxiety. Increase prn Vistaril   4. Insomnia. Discontinue Trazodone, start Restoril.  5. HTN. She is on Maxzide.  6. Dyslipidemia. She is on Zocor.  7. Family conflict. Family meeting would be helpful.  8. Cognitive decline. We will do cognitive testing when the patient feels better.   9. UTI. Urine culture pending.She is on Keflex.  10. Disposition. The patient will be discharged to home. She will follow up with her PCP. She needs psychiatry appointment.   Orson Slick, MD 10/20/2016, 9:42 AM

## 2016-10-20 NOTE — Progress Notes (Signed)
Pt awake and alert, up on unit tonight. Attended evening group. Continues to complain of confusion and depression. Denies SI/HI/AVH. Pleasant, cooperative, needs some redirection with finding her room. Medication compliant. Daughter came to visit tonight, pt reported good visit. Pt voiced her daughter was her main support as pt lives with her mother. Pt reported her mother and sisters "always make out like I'm doing wrong."   Support and encouragement provided with use of therapeutic communication. Medications administered as ordered with education. Safety maintained with every 15 minute checks. Will continue to monitor.

## 2016-10-20 NOTE — Plan of Care (Signed)
Problem: Coping: Goal: Ability to verbalize frustrations and anger appropriately will improve Outcome: Progressing Pt is open regarding her feelings. Complains of confusion, depression. Denies SI/HI/AVH.

## 2016-10-20 NOTE — [facility] Group Notes (Signed)
[place] Group Notes:  (Nursing/MHT/Case Management/Adjunct)  Date:  10/20/2016  Time:  9:24 PM  Type of Therapy:  Psychoeducational Skills  Participation Level:  Active  Participation Quality:  Appropriate  Affect:  Appropriate  Cognitive:  Appropriate  Insight:  Good  Engagement in Group:  Supportive  Modes of Intervention:  Support   Summary of Progress/Problems:  Nehemiah Settle 10/20/2016, 9:24 PM

## 2016-10-20 NOTE — Progress Notes (Signed)
Recreation Therapy Notes  Date: 01.08.18 Time: 9:30 am Location: Craft Room  Group Topic: Self-expression  Goal Area(s) Addresses:  Patient will effectively use as a means of self-expression. Patient will recognize positive benefit of self-expression. Patient will be able to identify one emotion experienced during group session. Patient will identify use of art as a coping skill.  Behavioral Response: Attentive  Intervention: Two Faces of Me  Activity: Patients were given a blank face worksheet and were instructed to draw a line down the middle. On one side of the worksheet, patients were instructed to draw or write how they felt when they were admitted to the hospital. On the other side, patients were instructed to draw or write how they want to feel when they are d/c.  Education: LRT educated patients on other forms of self-expression.  Education Outcome: In group clarification offered  Clinical Observations/Feedback: Patient wrote how she felt when she was admitted to the hospital and how she wants to feel when she is d/c. Patient did not contribute to group discussion.  Leonette Monarch, LRT/CTRS 10/20/2016 10:20 AM

## 2016-10-20 NOTE — Progress Notes (Signed)
Patient pleasant and cooperative with care. Denies SI, HI, AVH. Reports being confused and depressed. Pt reports no improvement in mood.  Encouragement and support offered. Medications given as prescribed. Safety checks maintained. Pt med and group compliant. Appropriate with staff and peers. Remains safe on unit with q 15 min checks.

## 2016-10-21 ENCOUNTER — Inpatient Hospital Stay: Payer: No Typology Code available for payment source

## 2016-10-21 NOTE — [facility] Group Notes (Signed)
[place] LCSW Group Therapy   10/21/2016 1pm   Type of Therapy: Group Therapy   Participation Level: Active   Participation Quality: Attentive, Sharing and Supportive   Affect: Appropriate  Cognitive: Alert and Oriented   Insight: Developing/Improving and Engaged   Engagement in Therapy: Developing/Improving and Engaged   Modes of Intervention: Clarification, Confrontation, Discussion, Education, Exploration,  Limit-setting, Orientation, Problem-solving, Rapport Building, Art therapist, Socialization and Support  Summary of Progress/Problems: The topic for group therapy was feelings about diagnosis. Pt actively participated in group discussion on their past and current diagnosis and how they feel towards this. Pt also identified how society and family members judge them, based on their diagnosis as well as stereotypes and stigmas. Pt shared that the pt's diagnoses are unknown to her. Pt shared that the pt's family like to diagnose her but reported the pt never thought "I had a problem", until the pt was admitted into the hospital.  Pt shared the pt feels positive about the pt's diagnosis in that the pt now feels the pt will not be re-hospitalized now that the pt is taking medications. Pt was polite and cooperative with the CSW and other group members and focused and attentive to the topics discussed and the sharing of others.     Alphonse Guild. Rayel Santizo, LCSWA, LCAS

## 2016-10-21 NOTE — Progress Notes (Signed)
Pleasant and cooperative. No complaints. Took nighttime medications. Snack offered and accepted. Socializing with peers. No other concerns at present.

## 2016-10-21 NOTE — Tx Team (Signed)
Interdisciplinary Treatment and Diagnostic Plan Update  10/21/2016 Time of Session: [place] MRN: [id number]  Principal Diagnosis: Severe recurrent major depression without psychotic features [facility])  Secondary Diagnoses: Principal Problem:   Severe recurrent major depression without psychotic features ([place]) Active Problems:   Hypertension   UTI (urinary tract infection)   OCD (obsessive compulsive disorder)   Current Medications:  Current Facility-Administered Medications  Medication Dose Route Frequency Provider Last Rate Last Dose  . acetaminophen (TYLENOL) tablet 650 mg  650 mg Oral Q6H PRN Gonzella Lex, MD   650 mg at 10/17/16 [id number]  . alum & mag hydroxide-simeth (MAALOX/MYLANTA) 200-200-20 MG/5ML suspension 30 mL  30 mL Oral Q4H PRN Gonzella Lex, MD      . cephALEXin (KEFLEX) capsule 250 mg  250 mg Oral Q8H Jolanta B Pucilowska, MD   250 mg at 10/21/16 [id number]  . fluvoxaMINE (LUVOX) tablet 100 mg  100 mg Oral QHS Jolanta B Pucilowska, MD   100 mg at 10/20/16 2127  . hydrOXYzine (ATARAX/VISTARIL) tablet 50 mg  50 mg Oral TID PRN Lenward Chancellor, MD   50 mg at 10/21/16 0643  . Influenza vac split quadrivalent PF (FLUARIX) injection 0.5 mL  0.5 mL Intramuscular Tomorrow-1000 Jolanta B Pucilowska, MD      . magnesium hydroxide (MILK OF MAGNESIA) suspension 30 mL  30 mL Oral Daily PRN Gonzella Lex, MD      . PARoxetine (PAXIL) tablet 40 mg  40 mg Oral Daily Jolanta B Pucilowska, MD   40 mg at 10/21/16 0801  . risperiDONE (RISPERDAL) tablet 2 mg  2 mg Oral QHS Clovis Fredrickson, MD   2 mg at 10/20/16 2127  . simvastatin (ZOCOR) tablet 10 mg  10 mg Oral q1800 Gonzella Lex, MD   10 mg at 10/20/16 1705  . temazepam (RESTORIL) capsule 15 mg  15 mg Oral QHS Clovis Fredrickson, MD   15 mg at 10/20/16 2127  . triamterene-hydrochlorothiazide (MAXZIDE-25) 37.5-25 MG per tablet 1 tablet  1 tablet Oral Daily Gonzella Lex, MD   1 tablet at 10/21/16 0801   PTA  Medications: Prescriptions Prior to Admission  Medication Sig Dispense Refill Last Dose  . buPROPion (WELLBUTRIN XL) 300 MG 24 hr tablet Take 300 mg by mouth daily.     Marland Kitchen LORazepam (ATIVAN) 1 MG tablet Take 1 mg by mouth every 4 (four) hours as needed for anxiety.     Marland Kitchen PARoxetine (PAXIL) 20 MG tablet Take 20 mg by mouth daily.     . simvastatin (ZOCOR) 10 MG tablet Take 10 mg by mouth daily.     . potassium chloride (K-DUR) 10 MEQ tablet Take 4 tablets (40 mEq total) by mouth daily. 4 tablet 0     Patient Stressors: Health problems Medication change or noncompliance  Patient Strengths: Active sense of humor Average or above average intelligence Capable of independent living Communication skills Motivation for treatment/growth Work skills  Treatment Modalities: Medication Management, Group therapy, Case management,  1 to 1 session with clinician, Psychoeducation, Recreational therapy.   Physician Treatment Plan for Primary Diagnosis: Severe recurrent major depression without psychotic features ([place]) Long Term Goal(s): Improvement in symptoms so as ready for discharge NA   Short Term Goals: Ability to identify changes in lifestyle to reduce recurrence of condition will improve Ability to verbalize feelings will improve Ability to disclose and discuss suicidal ideas Ability to demonstrate self-control will improve Ability to identify and develop effective coping behaviors  will improve Ability to maintain clinical measurements within normal limits will improve Compliance with prescribed medications will improve Ability to identify triggers associated with substance abuse/mental health issues will improve NA  Medication Management: Evaluate patient's response, side effects, and tolerance of medication regimen.  Therapeutic Interventions: 1 to 1 sessions, Unit Group sessions and Medication administration.  Evaluation of Outcomes: Not Progressing  Physician Treatment Plan for  Secondary Diagnosis: Principal Problem:   Severe recurrent major depression without psychotic features ([place]) Active Problems:   Hypertension   UTI (urinary tract infection)   OCD (obsessive compulsive disorder)  Long Term Goal(s): Improvement in symptoms so as ready for discharge NA   Short Term Goals: Ability to identify changes in lifestyle to reduce recurrence of condition will improve Ability to verbalize feelings will improve Ability to disclose and discuss suicidal ideas Ability to demonstrate self-control will improve Ability to identify and develop effective coping behaviors will improve Ability to maintain clinical measurements within normal limits will improve Compliance with prescribed medications will improve Ability to identify triggers associated with substance abuse/mental health issues will improve NA     Medication Management: Evaluate patient's response, side effects, and tolerance of medication regimen.  Therapeutic Interventions: 1 to 1 sessions, Unit Group sessions and Medication administration.  Evaluation of Outcomes: Not Progressing   RN Treatment Plan for Primary Diagnosis: Severe recurrent major depression without psychotic features ([place]) Long Term Goal(s): Knowledge of disease and therapeutic regimen to maintain health will improve  Short Term Goals: Ability to identify and develop effective coping behaviors will improve  Medication Management: RN will administer medications as ordered by provider, will assess and evaluate patient's response and provide education to patient for prescribed medication. RN will report any adverse and/or side effects to prescribing provider.  Therapeutic Interventions: 1 on 1 counseling sessions, Psychoeducation, Medication administration, Evaluate responses to treatment, Monitor vital signs and CBGs as ordered, Perform/monitor CIWA, COWS, AIMS and Fall Risk screenings as ordered, Perform wound care treatments as  ordered.  Evaluation of Outcomes: Not Progressing   LCSW Treatment Plan for Primary Diagnosis: Severe recurrent major depression without psychotic features ([place]) Long Term Goal(s): Safe transition to appropriate next level of care at discharge, Engage patient in therapeutic group addressing interpersonal concerns.  Short Term Goals: Engage patient in aftercare planning with referrals and resources, Increase social support, Increase ability to appropriately verbalize feelings, Increase emotional regulation and Increase skills for wellness and recovery  Therapeutic Interventions: Assess for all discharge needs, 1 to 1 time with Social worker, Explore available resources and support systems, Assess for adequacy in community support network, Educate family and significant other(s) on suicide prevention, Complete Psychosocial Assessment, Interpersonal group therapy.  Evaluation of Outcomes: Not Progressing   Progress in Treatment: Attending groups: Yes. Participating in groups: Yes. Taking medication as prescribed: Yes. Toleration medication: Yes. Family/Significant other contact made: Yes, individual(s) contacted:  Kelsey Arroyo Patient understands diagnosis: Yes. Discussing patient identified problems/goals with staff: Yes. Medical problems stabilized or resolved: Yes. Denies suicidal/homicidal ideation: Yes. Issues/concerns per patient self-inventory:Yes Other: none  New problem(s) identified: No, Describe:  none  New Short Term/Long Term Goal(s):None  Discharge Plan or Barriers: CSW assessing for appropriate discharge plan.  Reason for Continuation of Hospitalization: Anxiety Depression  Estimated Length of Stay: 3-5 days.  Attendees: Patient: Kelsey Arroyo 10/21/2016   Physician: Dr Bary Leriche, MD 10/21/2016   Nursing: Floyde Parkins, RN 10/21/2016   RN Care Manager: 10/21/2016   Social Worker: Lurline Idol, LCSW 10/21/2016   Recreational  Therapist: Delle Reining 10/21/2016   Other:   10/21/2016   Other:  10/21/2016   Other: 10/21/2016     Scribe for Treatment Team: Joanne Chars, LCSW 10/21/2016 11:36 AM

## 2016-10-21 NOTE — [facility] Group Notes (Signed)
Goals Group  Date/Time: 9:00AM  Type of Therapy and Topic: Group Therapy: Goals Group: SMART Goals  ?  Participation Level: Moderate  ?  Description of Group:  ?  The purpose of a daily goals group is to assist and guide patients in setting recovery/wellness-related goals. The objective is to set goals as they relate to the crisis in which they were admitted. Patients will be using SMART goal modalities to set measurable goals. Characteristics of realistic goals will be discussed and patients will be assisted in setting and processing how one will reach their goal. Facilitator will also assist patients in applying interventions and coping skills learned in psycho-education groups to the SMART goal and process how one will achieve defined goal.  ?  Therapeutic Goals:  ?  -Patients will develop and document one goal related to or their crisis in which brought them into treatment.  -Patients will be guided by LCSW using SMART goal setting modality in how to set a measurable, attainable, realistic and time sensitive goal.  -Patients will process barriers in reaching goal.  -Patients will process interventions in how to overcome and successful in reaching goal.  ?  Patient's Goal: Pt's goal for today is learning what she needs to do in order to discharge. In order to accomplish this goal, pt stated that she will speak with treatment team about discharge. ?  Therapeutic Modalities:  Motivational Interviewing  Cognitive Behavioral Therapy  Crisis Intervention Model  SMART goals setting   Glorious Peach, MSW, LCSW-A 10/21/2016, 9:47AM

## 2016-10-21 NOTE — Progress Notes (Signed)
Pt pleasant and cooperative. Continues to be confused and needing redirection. Medication and group compliant. Appropriate with staff and peers. No negative behaviors. Pt anxious at times. Encouragement and support offered. Medications given as prescribed. Prn given for anxiety with good relief.  Pt receptive and remains safe on unit with q 15 min checks.

## 2016-10-21 NOTE — Progress Notes (Signed)
[facility] MD Progress Note  10/21/2016 1:40 PM TIANE SZYDLOWSKI  MRN:  [id number]  Subjective: Ms. Laurann Montana met with treatment team today. She reports absolutely no improvement since admission. She still complains of feeling very anxious and confused. When asked what she is confused about she is not able to answer. This is a striking change from her previous level of functioning as she works as a Scientist, water quality at [employer]. She is fidgety and rather disorganized on the interview. She tolerates medications and accepts them well. Sleep and appetite are okay. She has not been in contact with her family. She is very worried about returning to her mother's house. The patient has severe OCD and her mother is tired of dictation organizing her household. She denies any symptoms of UTI and has been taking Keflex for it. Urine culture was contaminated. The patient goes to groups and has been able to participate productively. We will order a head CT scan and neurocognitive testing to clarify diagnosis.  Per nursing: Pt awake and alert, up on unit tonight. Attended evening group. Continues to complain of confusion and depression. Denies SI/HI/AVH. Pleasant, cooperative, needs some redirection with finding her room. Medication compliant. Daughter came to visit tonight, pt reported good visit. Pt voiced her daughter was her main support as pt lives with her mother. Pt reported her mother and sisters "always make out like I'm doing wrong."   Support and encouragement provided with use of therapeutic communication. Medications administered as ordered with education. Safety maintained with every 15 minute checks. Will continue to monitor.  Principal Problem: Severe recurrent major depression without psychotic features ([place]) Diagnosis:   Patient Active Problem List   Diagnosis Date Noted  . UTI (urinary tract infection) [N39.0] 10/17/2016  . OCD (obsessive compulsive disorder) [F42.9] 10/17/2016  . Severe recurrent major depression  without psychotic features ([place]) [F33.2] 10/16/2016  . Hypertension [I10] 10/16/2016   Total Time spent with patient: 20 minutes  Past Psychiatric History: depression.  Past Medical History:  Past Medical History:  Diagnosis Date  . Depression   . Hyperlipemia   . Hypertension   . Skin cancer     Past Surgical History:  Procedure Laterality Date  . CESAREAN SECTION     x2   Family History: History reviewed. No pertinent family history. Family Psychiatric  History: See H&P. Social History:  History  Alcohol Use No     History  Drug Use No    Social History   Social History  . Marital status: Single    Spouse name: N/A  . Number of children: N/A  . Years of education: N/A   Social History Main Topics  . Smoking status: Never Smoker  . Smokeless tobacco: Never Used  . Alcohol use No  . Drug use: No  . Sexual activity: Not Asked   Other Topics Concern  . None   Social History Narrative  . None   Additional Social History:                         Sleep: Fair  Appetite:  Fair  Current Medications: Current Facility-Administered Medications  Medication Dose Route Frequency Provider Last Rate Last Dose  . acetaminophen (TYLENOL) tablet 650 mg  650 mg Oral Q6H PRN Gonzella Lex, MD   650 mg at 10/17/16 2778  . alum & mag hydroxide-simeth (MAALOX/MYLANTA) 200-200-20 MG/5ML suspension 30 mL  30 mL Oral Q4H PRN Gonzella Lex, MD      .  cephALEXin (KEFLEX) capsule 250 mg  250 mg Oral Q8H Keanan Melander B Jayland Null, MD   250 mg at 10/21/16 1017  . fluvoxaMINE (LUVOX) tablet 100 mg  100 mg Oral QHS Gabriellia Rempel B Karryn Kosinski, MD   100 mg at 10/20/16 2127  . hydrOXYzine (ATARAX/VISTARIL) tablet 50 mg  50 mg Oral TID PRN Lenward Chancellor, MD   50 mg at 10/21/16 0643  . Influenza vac split quadrivalent PF (FLUARIX) injection 0.5 mL  0.5 mL Intramuscular Tomorrow-1000 Fronia Depass B Keesha Pellum, MD      . magnesium hydroxide (MILK OF MAGNESIA) suspension 30 mL  30 mL Oral  Daily PRN Gonzella Lex, MD      . PARoxetine (PAXIL) tablet 40 mg  40 mg Oral Daily Karynn Deblasi B Elinor Kleine, MD   40 mg at 10/21/16 0801  . risperiDONE (RISPERDAL) tablet 2 mg  2 mg Oral QHS Clovis Fredrickson, MD   2 mg at 10/20/16 2127  . simvastatin (ZOCOR) tablet 10 mg  10 mg Oral q1800 Gonzella Lex, MD   10 mg at 10/20/16 1705  . temazepam (RESTORIL) capsule 15 mg  15 mg Oral QHS Clovis Fredrickson, MD   15 mg at 10/20/16 2127  . triamterene-hydrochlorothiazide (MAXZIDE-25) 37.5-25 MG per tablet 1 tablet  1 tablet Oral Daily Gonzella Lex, MD   1 tablet at 10/21/16 0801    Lab Results: No results found for this or any previous visit (from the past 48 hour(s)).  Blood Alcohol level:  Lab Results  Component Value Date   ETH <5 51/11/5850    Metabolic Disorder Labs: Lab Results  Component Value Date   HGBA1C 6.0 (H) 10/17/2016   MPG 126 10/17/2016   No results found for: PROLACTIN Lab Results  Component Value Date   CHOL 166 10/17/2016   TRIG 117 10/17/2016   HDL 49 10/17/2016   CHOLHDL 3.4 10/17/2016   VLDL 23 10/17/2016   LDLCALC 94 10/17/2016    Physical Findings: AIMS: Facial and Oral Movements Muscles of Facial Expression: None, normal Lips and Perioral Area: None, normal Jaw: None, normal Tongue: None, normal,Extremity Movements Upper (arms, wrists, hands, fingers): None, normal Lower (legs, knees, ankles, toes): None, normal, Trunk Movements Neck, shoulders, hips: None, normal, Overall Severity Severity of abnormal movements (highest score from questions above): None, normal Incapacitation due to abnormal movements: None, normal Patient's awareness of abnormal movements (rate only patient's report): No Awareness, Dental Status Current problems with teeth and/or dentures?: No Does patient usually wear dentures?: No  CIWA:    COWS:     Musculoskeletal: Strength & Muscle Tone: within normal limits Gait & Station: normal Patient leans:  N/A  Psychiatric Specialty Exam: Physical Exam  Nursing note and vitals reviewed.   Review of Systems  Psychiatric/Behavioral: Positive for memory loss. The patient is nervous/anxious.   All other systems reviewed and are negative.   Blood pressure 133/90, pulse 89, temperature 98.2 F (36.8 C), temperature source Oral, resp. rate 18, height '4\' 9"'  (1.448 m), weight 67.1 kg (148 lb), SpO2 100 %.Body mass index is 32.03 kg/m.  General Appearance: Casual  Eye Contact:  Good  Speech:  Clear and Coherent  Volume:  Normal  Mood:  Anxious, Hopeless and Worthless  Affect:  Blunt  Thought Process:  Disorganized and Descriptions of Associations: Tangential  Orientation:  Full (Time, Place, and Person)  Thought Content:  Paranoid Ideation  Suicidal Thoughts:  No  Homicidal Thoughts:  No  Memory:  Immediate;   Fair  Recent;   Fair Remote;   Fair  Judgement:  Impaired  Insight:  Lacking  Psychomotor Activity:  Decreased  Concentration:  Concentration: Poor and Attention Span: Poor  Recall:  Poor  Fund of Knowledge:  Fair  Language:  Fair  Akathisia:  No  Handed:  Right  AIMS (if indicated):     Assets:  Communication Skills Desire for Improvement Housing Physical Health Resilience Social Support Vocational/Educational  ADL's:  Intact  Cognition:  WNL  Sleep:  Number of Hours: 6.45     Treatment Plan Summary: Daily contact with patient to assess and evaluate symptoms and progress in treatment and Medication management   Ms. Prest is a 61 year old female with a history of depression admitted after presumed suicide attempt, that the patient denies, confusion and thought disorganization.   1. Suicidal ideation. The patient adamantly denies any thoughts, intentions, or plans to hurt herself or others.   2. Mood. We will continue Paxil 40 mg and keep titrating Luvox at night for OCD. I will discontinue Risperdal and gave Zyprexa to address restlessness.   3. Anxiety.  Vistaril is available.    4. Insomnia. She is on Restoril.  5. HTN. She is on Maxzide.  6. Dyslipidemia. She is on Zocor.  7. Family conflict. Family meeting would be helpful.  8. Cognitive decline. We will order head CT scan and neurocognitive testing.   9. UTI. Urine culture was contaminated. She is on Keflex.  10. Disposition. The patient will be discharged to home. She will follow up with her PCP. She needs psychiatry appointment.   Orson Slick, MD 10/21/2016, 1:40 PM

## 2016-10-21 NOTE — Plan of Care (Signed)
Problem: Education: Goal: Mental status will improve Outcome: Progressing Pt still confused and endorsing depression

## 2016-10-21 NOTE — Progress Notes (Signed)
Recreation Therapy Notes  Date: 01.09.18 Time: 9:30 am Location: Craft Room  Group Topic: Goal Setting  Goal Area(s) Addresses:  Patient will write at least one goal. Patient will write at least one obstacle.  Behavioral Response: Attentive, Interactive  Intervention: Recovery Goal Chart  Activity: Patients were instructed to make a Recovery Goal Chart including goals, obstacles, the date they started working on their goals, and the date they achieved their goals.  Education: LRT educated patients on healthy ways they can celebrate reaching their goals.  Education Outcome: Acknowledges education/In group clarification offered  Clinical Observations/Feedback: Patient wrote goals and an obstacle. Patient contributed to group discussion by stating what she should do after she achieves a goal.  Leonette Monarch, LRT/CTRS 10/21/2016 10:27 AM

## 2016-10-22 LAB — HIV ANTIBODY (ROUTINE TESTING W REFLEX): HIV Screen 4th Generation wRfx: NONREACTIVE

## 2016-10-22 LAB — RPR: RPR Ser Ql: NONREACTIVE

## 2016-10-22 LAB — VITAMIN B12: Vitamin B-12: 203 pg/mL (ref 180–914)

## 2016-10-22 MED ORDER — CYANOCOBALAMIN 1000 MCG/ML IJ SOLN
1000.0000 ug | Freq: Every day | INTRAMUSCULAR | Status: DC
  Administered 2016-10-22 – 2016-10-27 (×6): 1000 ug via INTRAMUSCULAR
  Filled 2016-10-22 (×6): qty 1

## 2016-10-22 NOTE — Progress Notes (Signed)
Recreation Therapy Notes  Date: 01.10.19 Time: 9:30 am Location: Craft Room  Group Topic: Self-esteem  Goal Area(s) Addresses:  Patient will write at least one positive trait about self. Patient will verbalize benefit of having a healthy self-esteem.  Behavioral Response: Attentive, Interactive  Intervention: I Am  Activity: Patients were given a worksheet with the letter I on it and were instructed to write as many positive traits about themselves inside the letter.  Education: LRT educated patients on ways to increase their self-esteem.  Education Outcome: Acknowledges education/In group clarification offered  Clinical Observations/Feedback: Patient initially wrote that she was irritated and confused. LRT encouraged patient to write positive traits. Patient wrote positive traits. Patient turned paper over and wrote names of people. Patient wrote that her medications were not right. Patient contributed to group discussion by stating how her self-esteem affects her.  Leonette Monarch, LRT/CTRS 10/22/2016 10:19 AM

## 2016-10-22 NOTE — B[facility] Group Notes (Signed)
[facility] LCSW Group Therapy   10/22/2016  1:00 pm   Type of Therapy: Group Therapy   Participation Level: Active   Participation Quality: Attentive, Sharing and Supportive   Affect: Appropriate   Cognitive: Alert and Oriented   Insight: Developing/Improving and Engaged   Engagement in Therapy: Developing/Improving and Engaged   Modes of Intervention: Clarification, Confrontation, Discussion, Education, Exploration, Limit-setting, Orientation, Problem-solving, Rapport Building, Art therapist, Socialization and Support   Summary of Progress/Problems: The topic for group today was emotional regulation. This group focused on both positive and negative emotion identification and allowed  group members to process ways to identify feelings, regulate negative emotions, and find healthy ways to manage internal/external emotions. Group members were asked to reflect on a time when their reaction to an emotion led to a negative outcome and explored how alternative responses using emotion regulation would have benefited them. Group members were also asked to discuss a time when emotion regulation was utilized when a negative emotion was experienced. Pt was appropriate and identified two emotions that led to her hospitalization as feeling sad and upset. She stated that those emotions created negative actions such as confusion and driving in "auto-pilot." To regulate those negative emotions and actions, pt identified finding a strong support system in the community in order to improve negative emotions.  Glorious Peach, MSW, LCSWA 10/22/2016, 1:59PM

## 2016-10-22 NOTE — Progress Notes (Signed)
[facility] MD Progress Note  10/22/2016 11:42 AM Kelsey Arroyo  MRN:  [id number]  Subjective:   1//2018. Kelsey Arroyo met with treatment team today. She reports absolutely no improvement since admission. She still complains of feeling very anxious and confused. When asked what she is confused about she is not able to answer. This is a striking change from her previous level of functioning as she works as a Scientist, water quality at [employer]. She is fidgety and rather disorganized on the interview. She tolerates medications and accepts them well. Sleep and appetite are okay. She has not been in contact with her family. She is very worried about returning to her mother's house. The patient has severe OCD and her mother is tired of dictation organizing her household. She denies any symptoms of UTI and has been taking Keflex for it. Urine culture was contaminated. The patient goes to groups and has been able to participate productively. We will order a head CT scan and neurocognitive testing to clarify diagnosis.  10/22/2016. Kelsey Arroyo feels very anxious today again. First thing in the morning she was asking for anxiety medication. It is true that she has been on Xanax for many years. She discontinued Xanax 5 days prior to admission and has been in the hospital for 6 days already. She should not be having physical symptoms of withdrawal. We would not recommend benzodiazepines at this point as there are considerable worries about cognitive decline. She met with our psychologist today to complete neuropsych testing. As she was diagnosed with mild to moderate cognitive impairment. Yesterday we change her Risperdal to Zyprexa in hopes that some of her symptoms were due to Risperdal side effects. The patient does not report any somatic symptoms. She takes medications as prescribed but she is not getting better.  Per nursing: Patient had an un-eventfull night; with having slept 7.45 hrs. This A.M., patient was awakened to give A.M.  Medication; and patient stated, "I need something for anxiety; I'm shaking; I just woke up; otherwise no further complaints were voiced.  Principal Problem: Severe recurrent major depression without psychotic features ([place]) Diagnosis:   Patient Active Problem List   Diagnosis Date Noted  . UTI (urinary tract infection) [N39.0] 10/17/2016  . OCD (obsessive compulsive disorder) [F42.9] 10/17/2016  . Severe recurrent major depression without psychotic features ([place]) [F33.2] 10/16/2016  . Hypertension [I10] 10/16/2016   Total Time spent with patient: 20 minutes  Past Psychiatric History: depression.  Past Medical History:  Past Medical History:  Diagnosis Date  . Depression   . Hyperlipemia   . Hypertension   . Skin cancer     Past Surgical History:  Procedure Laterality Date  . CESAREAN SECTION     x2   Family History: History reviewed. No pertinent family history. Family Psychiatric  History: See H&P. Social History:  History  Alcohol Use No     History  Drug Use No    Social History   Social History  . Marital status: Single    Spouse name: N/A  . Number of children: N/A  . Years of education: N/A   Social History Main Topics  . Smoking status: Never Smoker  . Smokeless tobacco: Never Used  . Alcohol use No  . Drug use: No  . Sexual activity: Not Asked   Other Topics Concern  . None   Social History Narrative  . None   Additional Social History:  Sleep: Fair  Appetite:  Fair  Current Medications: Current Facility-Administered Medications  Medication Dose Route Frequency Provider Last Rate Last Dose  . acetaminophen (TYLENOL) tablet 650 mg  650 mg Oral Q6H PRN Gonzella Lex, MD   650 mg at 10/17/16 6045  . alum & mag hydroxide-simeth (MAALOX/MYLANTA) 200-200-20 MG/5ML suspension 30 mL  30 mL Oral Q4H PRN Gonzella Lex, MD      . cephALEXin (KEFLEX) capsule 250 mg  250 mg Oral Q8H Emmali Karow B Female Minish, MD   250 mg  at 10/22/16 0551  . fluvoxaMINE (LUVOX) tablet 100 mg  100 mg Oral QHS Clovis Fredrickson, MD   100 mg at 10/21/16 2104  . hydrOXYzine (ATARAX/VISTARIL) tablet 50 mg  50 mg Oral TID PRN Lenward Chancellor, MD   50 mg at 10/21/16 1358  . Influenza vac split quadrivalent PF (FLUARIX) injection 0.5 mL  0.5 mL Intramuscular Tomorrow-1000 Maddison Kilner B Ludean Duhart, MD      . magnesium hydroxide (MILK OF MAGNESIA) suspension 30 mL  30 mL Oral Daily PRN Gonzella Lex, MD      . PARoxetine (PAXIL) tablet 40 mg  40 mg Oral Daily Raymondo Garcialopez B Marshe Shrestha, MD   40 mg at 10/22/16 0852  . risperiDONE (RISPERDAL) tablet 2 mg  2 mg Oral QHS Clovis Fredrickson, MD   2 mg at 10/21/16 2104  . simvastatin (ZOCOR) tablet 10 mg  10 mg Oral q1800 Gonzella Lex, MD   10 mg at 10/21/16 2105  . temazepam (RESTORIL) capsule 15 mg  15 mg Oral QHS Clovis Fredrickson, MD   15 mg at 10/21/16 2104  . triamterene-hydrochlorothiazide (MAXZIDE-25) 37.5-25 MG per tablet 1 tablet  1 tablet Oral Daily Gonzella Lex, MD   1 tablet at 10/22/16 4098    Lab Results:  Results for orders placed or performed during the hospital encounter of 10/16/16 (from the past 48 hour(s))  RPR     Status: None   Collection Time: 10/21/16  5:49 PM  Result Value Ref Range   RPR Ser Ql Non Reactive Non Reactive    Comment: (NOTE) Performed At: [facility] [address] [place], [place] [id number] Lindon Romp MD FA:[id number]   HIV antibody     Status: None   Collection Time: 10/21/16  5:49 PM  Result Value Ref Range   HIV Screen 4th Generation wRfx Non Reactive Non Reactive    Comment: (NOTE) Performed At: [facility] [address] [place], [place] [id number] Lindon Romp MD XL:[id number]     Blood Alcohol level:  Lab Results  Component Value Date   [facility] <5 36/64/4034    Metabolic Disorder Labs: Lab Results  Component Value Date   HGBA1C 6.0 (H) 10/17/2016   MPG 126 10/17/2016   No results found for:  PROLACTIN Lab Results  Component Value Date   CHOL 166 10/17/2016   TRIG 117 10/17/2016   HDL 49 10/17/2016   CHOLHDL 3.4 10/17/2016   VLDL 23 10/17/2016   LDLCALC 94 10/17/2016    Physical Findings: AIMS: Facial and Oral Movements Muscles of Facial Expression: None, normal Lips and Perioral Area: None, normal Jaw: None, normal Tongue: None, normal,Extremity Movements Upper (arms, wrists, hands, fingers): None, normal Lower (legs, knees, ankles, toes): None, normal, Trunk Movements Neck, shoulders, hips: None, normal, Overall Severity Severity of abnormal movements (highest score from questions above): None, normal Incapacitation due to abnormal movements: None, normal Patient's awareness of abnormal movements (rate only  patient's report): No Awareness, Dental Status Current problems with teeth and/or dentures?: No Does patient usually wear dentures?: No  CIWA:    COWS:     Musculoskeletal: Strength & Muscle Tone: within normal limits Gait & Station: normal Patient leans: N/A  Psychiatric Specialty Exam: Physical Exam  Nursing note and vitals reviewed.   Review of Systems  Psychiatric/Behavioral: Positive for memory loss. The patient is nervous/anxious.   All other systems reviewed and are negative.   Blood pressure 122/79, pulse 96, temperature 98.1 F (36.7 C), temperature source Oral, resp. rate 18, height '4\' 9"'  (1.448 m), weight 67.1 kg (148 lb), SpO2 100 %.Body mass index is 32.03 kg/m.  General Appearance: Casual  Eye Contact:  Good  Speech:  Clear and Coherent  Volume:  Normal  Mood:  Anxious, Hopeless and Worthless  Affect:  Blunt  Thought Process:  Disorganized and Descriptions of Associations: Tangential  Orientation:  Full (Time, Place, and Person)  Thought Content:  Paranoid Ideation  Suicidal Thoughts:  No  Homicidal Thoughts:  No  Memory:  Immediate;   Fair Recent;   Fair Remote;   Fair  Judgement:  Impaired  Insight:  Lacking  Psychomotor  Activity:  Decreased  Concentration:  Concentration: Poor and Attention Span: Poor  Recall:  Poor  Fund of Knowledge:  Fair  Language:  Fair  Akathisia:  No  Handed:  Right  AIMS (if indicated):     Assets:  Communication Skills Desire for Improvement Housing Physical Health Resilience Social Support Vocational/Educational  ADL's:  Intact  Cognition:  WNL  Sleep:  Number of Hours: 7.45     Treatment Plan Summary: Daily contact with patient to assess and evaluate symptoms and progress in treatment and Medication management   Kelsey Arroyo is a 60 year old female with a history of depression admitted after presumed suicide attempt, that the patient denies, confusion and thought disorganization.   1. Suicidal ideation. The patient adamantly denies any thoughts, intentions, or plans to hurt herself or others.   2. Mood. We will continue to taper Paxil off. She is at 20 mg today. We will keep titrating Luvox at night for OCD. She is on 200 mg today. We will continue Zyprexa 10 mg.    3. Anxiety. Vistaril is available.    4. Insomnia. We will discontinue Restoril.   5. HTN. She is on Maxzide.  6. Dyslipidemia. She is on Zocor.  7. Family conflict. Family meeting would be helpful.  8. Cognitive decline. Head CT scan shows mild small vessel disease. Neurocognitive testing shows mild to moderate cognitive decline. See Psychological consultation. RPR and HIV negative.   9. UTI. Urine culture was contaminated. She is on Keflex.  10. Disposition. The patient will be discharged to home. She will follow up with her PCP. She needs psychiatry appointment.   Orson Slick, MD 10/22/2016, 11:42 AM

## 2016-10-22 NOTE — Progress Notes (Signed)
Patient had an un-eventfull night; with having slept 7.45 hrs. This A.M., patient was awakened to give A.M. Medication; and patient stated, "I need something for anxiety; I'm shaking; I just woke up; otherwise no further complaints were voiced.

## 2016-10-22 NOTE — Plan of Care (Signed)
Problem: Coping: Goal: Ability to verbalize frustrations and anger appropriately will improve Outcome: Progressing Working on Scientific laboratory technician , handout given

## 2016-10-22 NOTE — Progress Notes (Signed)
D;Affect cheerful on approach this am shift . Noted interacting with her peers on unit  Gait normal and speech. Noted participatory with unit programing . Compliant with  With medication pass Voice of being depressed . Stated her concentration  Level  Is fair, energy also fair .  Depression , Anxiety and Hope level 9 ( low 0-10 high) Patient  Voice of driving certain places  And returning home  Because she knows that she could easily get loss. Denies suicidal  homicidal ideations  .  No auditory hallucinations  No pain concerns . Appropriate ADL'S. A: Encourage patient participation with unit programming . Instruction  Given on  Medication , verbalize understanding. R: Voice no other concerns. Staff continue to monitor

## 2016-10-23 LAB — URINALYSIS, COMPLETE (UACMP) WITH MICROSCOPIC
BILIRUBIN URINE: NEGATIVE
Bacteria, UA: NONE SEEN
GLUCOSE, UA: NEGATIVE mg/dL
HGB URINE DIPSTICK: NEGATIVE
Ketones, ur: NEGATIVE mg/dL
LEUKOCYTES UA: NEGATIVE
NITRITE: NEGATIVE
PH: 6 (ref 5.0–8.0)
Protein, ur: NEGATIVE mg/dL
SPECIFIC GRAVITY, URINE: 1.012 (ref 1.005–1.030)

## 2016-10-23 MED ORDER — OLANZAPINE 5 MG PO TABS
5.0000 mg | ORAL_TABLET | Freq: Every day | ORAL | Status: DC
  Administered 2016-10-23: 5 mg via ORAL
  Filled 2016-10-23: qty 1

## 2016-10-23 NOTE — BHH Group Notes (Signed)
[facility] LCSW Group Therapy Note  Date/Time: 10/23/16 1300  Type of Therapy/Topic:  Group Therapy:  Balance in Life  Participation Level:  active  Description of Group:    This group will address the concept of balance and how it feels and looks when one is unbalanced. Patients will be encouraged to process areas in their lives that are out of balance, and identify reasons for remaining unbalanced. Facilitators will guide patients utilizing problem- solving interventions to address and correct the stressor making their life unbalanced. Understanding and applying boundaries will be explored and addressed for obtaining  and maintaining a balanced life. Patients will be encouraged to explore ways to assertively make their unbalanced needs known to significant others in their lives, using other group members and facilitator for support and feedback.  Therapeutic Goals: 1. Patient will identify two or more emotions or situations they have that consume much of in their lives. 2. Patient will identify signs/triggers that life has become out of balance:  3. Patient will identify two ways to set boundaries in order to achieve balance in their lives:  4. Patient will demonstrate ability to communicate their needs through discussion and/or role plays  Summary of Patient Progress: Pt remained engaged throughout the group.  She allowed CSW to lead her through a series of questions related to her family responsiblities dominating her life and she then discussed with the group how hard it is for her to set boundaries with family members.       Therapeutic Modalities:   Cognitive Behavioral Therapy Solution-Focused Therapy Assertiveness Training  Lurline Idol, [place]

## 2016-10-23 NOTE — Progress Notes (Signed)
Recreation Therapy Notes  Date: 01.11.18 Time: 9:30 am Location: Craft Room  Group Topic: Leisure Education  Goal Area(s) Addresses:  Patient will identify activities for each letter of the alphabet. Patient will verbalize ability to integrate positive leisure into life post d/c. Patient will verbalize ability to use leisure as a Technical sales engineer.  Behavioral Response: Attentive, Interactive, Left early  Intervention: Leisure Alphabet  Activity: Patients were given a Leisure Air traffic controller and were instructed to write healthy leisure activities for each letter of the alphabet.  Education: LRT educated patients on what they need to participate in leisure.  Education Outcome: Patient left before LRT educated group.   Clinical Observations/Feedback: Patient looked at worksheet for a while and looked at other paper she brought. Patient wrote "apple" on her worksheet. Patient contributed to group discussion. When asked about leisure activities, patient stated "cotton". Patient corrected herself when she realized these were leisure activities. Patient did state some healthy leisure activities. Patient left group at approximately 9:50 am and did not return to group.  Leonette Monarch, LRT/CTRS 10/23/2016 10:09 AM

## 2016-10-23 NOTE — Progress Notes (Signed)
D: Affect cheerful on approach. Limited interaction with peers . Voice no concerns around sleep . Appetite good . Appropriate  ADL and personal choresEnergy level  Is normal. Stated concentration poor . Stated onDenies suicidal  homicidal ideations  .  No auditory hallucinations  No pain concerns . Appropriate ADL'S. Interacting with peers and staff.  A: Encourage patient participation with unit programming . Instruction  Given on  Medication , verbalize understanding. R: Voice no other concerns. Staff continue to monitor

## 2016-10-23 NOTE — Progress Notes (Signed)
Patient ID: Kelsey Arroyo, female   DOB: 23-Dec-1955, 61 y.o.   MRN: [id number] Pleasant, approachable, cooperative, interacting well with peers, slightly anxious, denied pain, A&Ox2, re-oriented time, day and place; "I want to do whatever to get out of here....my thinking and driving, I was confused driving on the wrong road....; I get confused, I can't find my room that's why I have my name on the door ..." Cmplied with medications, currently on Keflex for UTI; UA still pending, specimen cup provided. Patient denied SI/HI/, denied AV/H.

## 2016-10-23 NOTE — Plan of Care (Signed)
Problem: Activity: Goal: Sleeping patterns will improve Outcome: Progressing Patient slept for Estimated Hours of 7.45; safety maintained, no injury or falls during this shift.

## 2016-10-23 NOTE — Progress Notes (Signed)
[facility] MD Progress Note  10/23/2016 12:28 PM Kelsey Arroyo  MRN:  [id number]  Subjective:   1//2018. Kelsey Arroyo met with treatment team today. She reports absolutely no improvement since admission. She still complains of feeling very anxious and confused. When asked what she is confused about she is not able to answer. This is a striking change from her previous level of functioning as she works as a Scientist, water quality at [employer]. She is fidgety and rather disorganized on the interview. She tolerates medications and accepts them well. Sleep and appetite are okay. She has not been in contact with her family. She is very worried about returning to her mother's house. The patient has severe OCD and her mother is tired of dictation organizing her household. She denies any symptoms of UTI and has been taking Keflex for it. Urine culture was contaminated. The patient goes to groups and has been able to participate productively. We will order a head CT scan and neurocognitive testing to clarify diagnosis.  10/22/2016. Kelsey Arroyo feels very anxious today again. First thing in the morning she was asking for anxiety medication. It is true that she has been on Xanax for many years. She discontinued Xanax 5 days prior to admission and has been in the hospital for 6 days already. She should not be having physical symptoms of withdrawal. We would not recommend benzodiazepines at this point as there are considerable worries about cognitive decline. She met with our psychologist today to complete neuropsych testing. As she was diagnosed with mild to moderate cognitive impairment. Yesterday we change her Risperdal to Zyprexa in hopes that some of her symptoms were due to Risperdal side effects. The patient does not report any somatic symptoms. She takes medications as prescribed but she is not getting better.  10/23/2016. Kelsey Arroyo is a patch better today. She is showered and very nicely dressed. For the first time we are able to have a  pretty decent conversation. She remembered that she gave urine son called this morning. He had urine culture was unsuccessful and she was given Nexium and she was given Keflex ex juvantibus to treat urinary tract infection. Her urine is clear now. The patient still reports foul smell of urine. She reports feeling shaky but is not as preoccupied with family conflict. She does not believe she could return to work or driving today. She accepts medications and tolerates it well I plan to switch her Risperdal to Zyprexa but failed to do so yesterday they. She will received first dose of Zyprexa tonight. It's not impossible that some of her restlessness is from Risperdal.  Per nursing: Pleasant, approachable, cooperative, interacting well with peers, slightly anxious, denied pain, A&Ox2, re-oriented time, day and place; "I want to do whatever to get out of here....my thinking and driving, I was confused driving on the wrong road....; I get confused, I can't find my room that's why I have my name on the door ..." Cmplied with medications, currently on Keflex for UTI; UA still pending, specimen cup provided. Patient denied SI/HI/, denied AV/H.  Principal Problem: Severe recurrent major depression without psychotic features ([place]) Diagnosis:   Patient Active Problem List   Diagnosis Date Noted  . UTI (urinary tract infection) [N39.0] 10/17/2016  . OCD (obsessive compulsive disorder) [F42.9] 10/17/2016  . Severe recurrent major depression without psychotic features ([address]) [F33.2] 10/16/2016  . Hypertension [I10] 10/16/2016   Total Time spent with patient: 20 minutes  Past Psychiatric History: depression.  Past Medical History:  Past Medical History:  Diagnosis Date  . Depression   . Hyperlipemia   . Hypertension   . Skin cancer     Past Surgical History:  Procedure Laterality Date  . CESAREAN SECTION     x2   Family History: History reviewed. No pertinent family history. Family Psychiatric   History: See H&P. Social History:  History  Alcohol Use No     History  Drug Use No    Social History   Social History  . Marital status: Single    Spouse name: N/A  . Number of children: N/A  . Years of education: N/A   Social History Main Topics  . Smoking status: Never Smoker  . Smokeless tobacco: Never Used  . Alcohol use No  . Drug use: No  . Sexual activity: Not Asked   Other Topics Concern  . None   Social History Narrative  . None   Additional Social History:                         Sleep: Fair  Appetite:  Fair  Current Medications: Current Facility-Administered Medications  Medication Dose Route Frequency Provider Last Rate Last Dose  . acetaminophen (TYLENOL) tablet 650 mg  650 mg Oral Q6H PRN Gonzella Lex, MD   650 mg at 10/17/16 3354  . alum & mag hydroxide-simeth (MAALOX/MYLANTA) 200-200-20 MG/5ML suspension 30 mL  30 mL Oral Q4H PRN Gonzella Lex, MD      . cephALEXin (KEFLEX) capsule 250 mg  250 mg Oral Q8H Arionne Iams B Jordanny Waddington, MD   250 mg at 10/23/16 5625  . cyanocobalamin ((VITAMIN B-12)) injection 1,000 mcg  1,000 mcg Intramuscular Daily Clovis Fredrickson, MD   1,000 mcg at 10/23/16 0840  . fluvoxaMINE (LUVOX) tablet 100 mg  100 mg Oral QHS Gabrianna Fassnacht B Lacresia Darwish, MD   100 mg at 10/22/16 2121  . hydrOXYzine (ATARAX/VISTARIL) tablet 50 mg  50 mg Oral TID PRN Lenward Chancellor, MD   50 mg at 10/21/16 1358  . Influenza vac split quadrivalent PF (FLUARIX) injection 0.5 mL  0.5 mL Intramuscular Tomorrow-1000 Nikesha Kwasny B Hodges Treiber, MD      . magnesium hydroxide (MILK OF MAGNESIA) suspension 30 mL  30 mL Oral Daily PRN Gonzella Lex, MD      . OLANZapine (ZYPREXA) tablet 5 mg  5 mg Oral QHS Evone Arseneau B Akansha Wyche, MD      . PARoxetine (PAXIL) tablet 40 mg  40 mg Oral Daily Wilkins Elpers B Bluma Buresh, MD   40 mg at 10/23/16 0841  . simvastatin (ZOCOR) tablet 10 mg  10 mg Oral q1800 Gonzella Lex, MD   10 mg at 10/22/16 1839  .  triamterene-hydrochlorothiazide (MAXZIDE-25) 37.5-25 MG per tablet 1 tablet  1 tablet Oral Daily Gonzella Lex, MD   1 tablet at 10/23/16 6389    Lab Results:  Results for orders placed or performed during the hospital encounter of 10/16/16 (from the past 48 hour(s))  RPR     Status: None   Collection Time: 10/21/16  5:49 PM  Result Value Ref Range   RPR Ser Ql Non Reactive Non Reactive    Comment: (NOTE) Performed At: [facility] [place], [place] [id number] Lindon Romp MD [id number]   HIV antibody     Status: None   Collection Time: 10/21/16  5:49 PM  Result Value Ref Range   HIV Screen 4th Generation wRfx Non Reactive Non  Reactive    Comment: (NOTE) Performed At: [facility] [place], [place] [id number] Lindon Romp MD [id number]   Vitamin B12     Status: None   Collection Time: 10/21/16  5:49 PM  Result Value Ref Range   Vitamin B-12 203 180 - 914 pg/mL    Comment: (NOTE) This assay is not validated for testing neonatal or myeloproliferative syndrome specimens for Vitamin B12 levels. Performed at [facility]   Urinalysis, Complete w Microscopic     Status: Abnormal   Collection Time: 10/23/16  9:07 AM  Result Value Ref Range   Color, Urine YELLOW (A) YELLOW   APPearance CLEAR (A) CLEAR   Specific Gravity, Urine 1.012 1.005 - 1.030   pH 6.0 5.0 - 8.0   Glucose, UA NEGATIVE NEGATIVE mg/dL   Hgb urine dipstick NEGATIVE NEGATIVE   Bilirubin Urine NEGATIVE NEGATIVE   Ketones, ur NEGATIVE NEGATIVE mg/dL   Protein, ur NEGATIVE NEGATIVE mg/dL   Nitrite NEGATIVE NEGATIVE   Leukocytes, UA NEGATIVE NEGATIVE   RBC / HPF 0-5 0 - 5 RBC/hpf   WBC, UA 0-5 0 - 5 WBC/hpf   Bacteria, UA NONE SEEN NONE SEEN   Squamous Epithelial / LPF 0-5 (A) NONE SEEN   Mucous PRESENT    Non Squamous Epithelial 0-5 (A) NONE SEEN    Blood Alcohol level:  Lab Results  Component Value Date   ETH <5 14/48/1856    Metabolic  Disorder Labs: Lab Results  Component Value Date   HGBA1C 6.0 (H) 10/17/2016   MPG 126 10/17/2016   No results found for: PROLACTIN Lab Results  Component Value Date   CHOL 166 10/17/2016   TRIG 117 10/17/2016   HDL 49 10/17/2016   CHOLHDL 3.4 10/17/2016   VLDL 23 10/17/2016   LDLCALC 94 10/17/2016    Physical Findings: AIMS: Facial and Oral Movements Muscles of Facial Expression: None, normal Lips and Perioral Area: None, normal Jaw: None, normal Tongue: None, normal,Extremity Movements Upper (arms, wrists, hands, fingers): None, normal Lower (legs, knees, ankles, toes): None, normal, Trunk Movements Neck, shoulders, hips: None, normal, Overall Severity Severity of abnormal movements (highest score from questions above): None, normal Incapacitation due to abnormal movements: None, normal Patient's awareness of abnormal movements (rate only patient's report): No Awareness, Dental Status Current problems with teeth and/or dentures?: No Does patient usually wear dentures?: No  CIWA:    COWS:     Musculoskeletal: Strength & Muscle Tone: within normal limits Gait & Station: normal Patient leans: N/A  Psychiatric Specialty Exam: Physical Exam  Nursing note and vitals reviewed.   Review of Systems  Psychiatric/Behavioral: Positive for memory loss. The patient is nervous/anxious.   All other systems reviewed and are negative.   Blood pressure 119/77, pulse 98, temperature 97.9 F (36.6 C), temperature source Oral, resp. rate 18, height '4\' 9"'  (1.448 m), weight 67.1 kg (148 lb), SpO2 100 %.Body mass index is 32.03 kg/m.  General Appearance: Casual  Eye Contact:  Good  Speech:  Clear and Coherent  Volume:  Normal  Mood:  Anxious, Hopeless and Worthless  Affect:  Blunt  Thought Process:  Disorganized and Descriptions of Associations: Tangential  Orientation:  Full (Time, Place, and Person)  Thought Content:  Paranoid Ideation  Suicidal Thoughts:  No  Homicidal  Thoughts:  No  Memory:  Immediate;   Fair Recent;   Fair Remote;   Fair  Judgement:  Impaired  Insight:  Lacking  Psychomotor Activity:  Decreased  Concentration:  Concentration: Poor and Attention Span: Poor  Recall:  Poor  Fund of Knowledge:  Fair  Language:  Fair  Akathisia:  No  Handed:  Right  AIMS (if indicated):     Assets:  Communication Skills Desire for Improvement Housing Physical Health Resilience Social Support Vocational/Educational  ADL's:  Intact  Cognition:  WNL  Sleep:  Number of Hours: 7.45     Treatment Plan Summary: Daily contact with patient to assess and evaluate symptoms and progress in treatment and Medication management   Kelsey Arroyo is a 61 year old female with a history of depression admitted after presumed suicide attempt, that the patient denies, confusion and thought disorganization.   1. Suicidal ideation. The patient adamantly denies any thoughts, intentions, or plans to hurt herself or others.   2. Mood. We will continue to taper Paxil off. She is at 20 mg today. We will keep titrating Luvox at night for OCD. She is on 200 mg today. We will continue Zyprexa 10 mg.    3. Anxiety. Vistaril is available.    4. Insomnia. We will discontinue Restoril.   5. HTN. She is on Maxzide.  6. Dyslipidemia. She is on Zocor.  7. Family conflict. Family meeting would be helpful.  8. Cognitive decline. Head CT scan shows mild small vessel disease. Neurocognitive testing shows mild to moderate cognitive decline. See Psychological consultation. RPR and HIV negative.   9. UTI. Urine culture was contaminated. She is on Keflex.  10. Disposition. The patient will be discharged to home. She will follow up with her PCP. She needs psychiatry appointment.   Orson Slick, MD 10/23/2016, 12:28 PM

## 2016-10-23 NOTE — Tx Team (Signed)
Interdisciplinary Treatment and Diagnostic Plan Update  10/23/2016 Time of Session: 10:30am Kelsey Arroyo MRN: [id number]  Principal Diagnosis: Severe recurrent major depression without psychotic features [facility])  Secondary Diagnoses: Principal Problem:   Severe recurrent major depression without psychotic features ([place]) Active Problems:   Hypertension   UTI (urinary tract infection)   OCD (obsessive compulsive disorder)   Current Medications:  Current Facility-Administered Medications  Medication Dose Route Frequency Provider Last Rate Last Dose  . acetaminophen (TYLENOL) tablet 650 mg  650 mg Oral Q6H PRN Gonzella Lex, MD   650 mg at 10/17/16 [id number]  . alum & mag hydroxide-simeth (MAALOX/MYLANTA) 200-200-20 MG/5ML suspension 30 mL  30 mL Oral Q4H PRN Gonzella Lex, MD      . cephALEXin (KEFLEX) capsule 250 mg  250 mg Oral Q8H Jolanta B Pucilowska, MD   250 mg at 10/23/16 [id number]  . cyanocobalamin ((VITAMIN B-12)) injection 1,000 mcg  1,000 mcg Intramuscular Daily Clovis Fredrickson, MD   1,000 mcg at 10/23/16 0840  . fluvoxaMINE (LUVOX) tablet 100 mg  100 mg Oral QHS Jolanta B Pucilowska, MD   100 mg at 10/22/16 2121  . hydrOXYzine (ATARAX/VISTARIL) tablet 50 mg  50 mg Oral TID PRN Lenward Chancellor, MD   50 mg at 10/21/16 1358  . Influenza vac split quadrivalent PF (FLUARIX) injection 0.5 mL  0.5 mL Intramuscular Tomorrow-1000 Jolanta B Pucilowska, MD      . magnesium hydroxide (MILK OF MAGNESIA) suspension 30 mL  30 mL Oral Daily PRN Gonzella Lex, MD      . OLANZapine (ZYPREXA) tablet 5 mg  5 mg Oral QHS Jolanta B Pucilowska, MD      . PARoxetine (PAXIL) tablet 40 mg  40 mg Oral Daily Jolanta B Pucilowska, MD   40 mg at 10/23/16 0841  . simvastatin (ZOCOR) tablet 10 mg  10 mg Oral q1800 Gonzella Lex, MD   10 mg at 10/22/16 1839  . triamterene-hydrochlorothiazide (MAXZIDE-25) 37.5-25 MG per tablet 1 tablet  1 tablet Oral Daily Gonzella Lex, MD   1 tablet at 10/23/16 0841   PTA  Medications: Prescriptions Prior to Admission  Medication Sig Dispense Refill Last Dose  . buPROPion (WELLBUTRIN XL) 300 MG 24 hr tablet Take 300 mg by mouth daily.     Marland Kitchen LORazepam (ATIVAN) 1 MG tablet Take 1 mg by mouth every 4 (four) hours as needed for anxiety.     Marland Kitchen PARoxetine (PAXIL) 20 MG tablet Take 20 mg by mouth daily.     . simvastatin (ZOCOR) 10 MG tablet Take 10 mg by mouth daily.     . potassium chloride (K-DUR) 10 MEQ tablet Take 4 tablets (40 mEq total) by mouth daily. 4 tablet 0     Patient Stressors: Health problems Medication change or noncompliance  Patient Strengths: Active sense of humor Average or above average intelligence Capable of independent living Communication skills Motivation for treatment/growth Work skills  Treatment Modalities: Medication Management, Group therapy, Case management,  1 to 1 session with clinician, Psychoeducation, Recreational therapy.   Physician Treatment Plan for Primary Diagnosis: Severe recurrent major depression without psychotic features ([place]) Long Term Goal(s): Improvement in symptoms so as ready for discharge NA   Short Term Goals: Ability to identify changes in lifestyle to reduce recurrence of condition will improve Ability to verbalize feelings will improve Ability to disclose and discuss suicidal ideas Ability to demonstrate self-control will improve Ability to identify and develop effective coping behaviors will improve  Ability to maintain clinical measurements within normal limits will improve Compliance with prescribed medications will improve Ability to identify triggers associated with substance abuse/mental health issues will improve NA  Medication Management: Evaluate patient's response, side effects, and tolerance of medication regimen.  Therapeutic Interventions: 1 to 1 sessions, Unit Group sessions and Medication administration.  Evaluation of Outcomes: Progressing  Physician Treatment Plan for  Secondary Diagnosis: Principal Problem:   Severe recurrent major depression without psychotic features ([place]) Active Problems:   Hypertension   UTI (urinary tract infection)   OCD (obsessive compulsive disorder)  Long Term Goal(s): Improvement in symptoms so as ready for discharge NA   Short Term Goals: Ability to identify changes in lifestyle to reduce recurrence of condition will improve Ability to verbalize feelings will improve Ability to disclose and discuss suicidal ideas Ability to demonstrate self-control will improve Ability to identify and develop effective coping behaviors will improve Ability to maintain clinical measurements within normal limits will improve Compliance with prescribed medications will improve Ability to identify triggers associated with substance abuse/mental health issues will improve NA     Medication Management: Evaluate patient's response, side effects, and tolerance of medication regimen.  Therapeutic Interventions: 1 to 1 sessions, Unit Group sessions and Medication administration.  Evaluation of Outcomes: Progressing   RN Treatment Plan for Primary Diagnosis: Severe recurrent major depression without psychotic features ([place]) Long Term Goal(s): Knowledge of disease and therapeutic regimen to maintain health will improve  Short Term Goals: Ability to identify and develop effective coping behaviors will improve  Medication Management: RN will administer medications as ordered by provider, will assess and evaluate patient's response and provide education to patient for prescribed medication. RN will report any adverse and/or side effects to prescribing provider.  Therapeutic Interventions: 1 on 1 counseling sessions, Psychoeducation, Medication administration, Evaluate responses to treatment, Monitor vital signs and CBGs as ordered, Perform/monitor CIWA, COWS, AIMS and Fall Risk screenings as ordered, Perform wound care treatments as  ordered.  Evaluation of Outcomes: Progressing   LCSW Treatment Plan for Primary Diagnosis: Severe recurrent major depression without psychotic features ([place]) Long Term Goal(s): Safe transition to appropriate next level of care at discharge, Engage patient in therapeutic group addressing interpersonal concerns.  Short Term Goals: Engage patient in aftercare planning with referrals and resources, Increase social support, Increase ability to appropriately verbalize feelings, Increase emotional regulation and Increase skills for wellness and recovery  Therapeutic Interventions: Assess for all discharge needs, 1 to 1 time with Social worker, Explore available resources and support systems, Assess for adequacy in community support network, Educate family and significant other(s) on suicide prevention, Complete Psychosocial Assessment, Interpersonal group therapy.  Evaluation of Outcomes: Progressing   Progress in Treatment: Attending groups: Yes. Participating in groups: Yes. Taking medication as prescribed: Yes. Toleration medication: Yes. Family/Significant other contact made: Yes, individual(s) contacted:  daughter Patient understands diagnosis: Yes. Discussing patient identified problems/goals with staff: Yes. Medical problems stabilized or resolved: Yes. Denies suicidal/homicidal ideation: Yes. Issues/concerns per patient self-inventory: No. Other: n/a  New problem(s) identified: None identified at this time.   New Short Term/Long Term Goal(s): None identified at this time.   Discharge Plan or Barriers: CSW assessing for appropriate discharge plan.   Reason for Continuation of Hospitalization: Depression Medication stabilization Other; describe Confusion  Estimated Length of Stay: 3 to 5 days.   Attendees: Patient: Kelsey Arroyo 10/23/2016 11:22 AM  Physician: Dr. Bary Leriche, MD 10/23/2016 11:22 AM  Nursing: Polly Cobia, RN 10/23/2016 11:22 AM  RN  Care Manager: 10/23/2016  11:22 AM  Social Worker: Jen Mow. Satira Sark 10/23/2016 11:22 AM  Recreational Therapist: Leonette Monarch, LRT/CTRS 10/23/2016 11:22 AM  Other:  10/23/2016 11:22 AM  Other:  10/23/2016 11:22 AM  Other: 10/23/2016 11:22 AM    Scribe for Treatment Team: Jolaine Click, LCSWA 10/23/2016 11:28 AM

## 2016-10-23 NOTE — Plan of Care (Signed)
Problem: Coping: Goal: Ability to verbalize frustrations and anger appropriately will improve Outcome: Progressing Continue to work on coping skills    

## 2016-10-23 NOTE — B[facility] Group Notes (Signed)
Goals Group Date/Time: 10/23/2016 9:00 AM Type of Therapy and Topic: Group Therapy: Goals Group: SMART Goals   Participation Level: Pt did not attend.  Description of Group:    The purpose of a daily goals group is to assist and guide patients in setting recovery/wellness-related goals. The objective is to set goals as they relate to the crisis in which they were admitted. Patients will be using SMART goal modalities to set measurable goals. Characteristics of realistic goals will be discussed and patients will be assisted in setting and processing how one will reach their goal. Facilitator will also assist patients in applying interventions and coping skills learned in psycho-education groups to the SMART goal and process how one will achieve defined goal.   Therapeutic Goals:   -Patients will develop and document one goal related to or their crisis in which brought them into treatment.  -Patients will be guided by LCSW using SMART goal setting modality in how to set a measurable, attainable, realistic and time sensitive goal.  -Patients will process barriers in reaching goal.  -Patients will process interventions in how to overcome and successful in reaching goal.   Patient's Goal: Pt did not attend.   Therapeutic Modalities:  Motivational Interviewing  Art gallery manager  SMART goals setting   Lurline Idol, [place]

## 2016-10-23 NOTE — B[facility] Group Notes (Signed)
[facility] Group Notes:  (Nursing/MHT/Case Management/Adjunct)  Date:  10/23/2016  Time:  3:43 PM  Type of Therapy:  Psychoeducational Skills  Participation Level:  Active  Participation Quality:  Appropriate, Attentive and Supportive  Affect:  Appropriate  Cognitive:  Appropriate  Insight:  Appropriate  Engagement in Group:  Engaged and Supportive  Modes of Intervention:  Discussion and Education  Summary of Progress/Problems:  Charise Killian 10/23/2016, 3:43 PM

## 2016-10-23 NOTE — Consult Note (Signed)
Psychological Assessment   Name: Kelsey Arroyo Age: 61 Date of Evaluation: 10/22/16 Test(s) Administered: Rebeca Alert Gestalt  Trail Making Test Parts A & B  Reason for Referral: [name] was referred for a psychological assessment by her physician, Donato Heinz, MD.  She was admitted to [facility] for the treatment of increased anxiety and depression. She has a long history of both anxiety and depression as well as OCD symptoms.  Please see the history and physical for additional background information. During her hospitalization increasing confusion was noted. She had difficulty finding her way around the unit. A CT of the head was obtained. The CT findings were "mild atrophy and small vessel changes similar to priors." An assessment of cognitive functioning was requested.  Validity: [name] was pleasant and cooperative with the testing process. She attempted all tasks requested of her. She appeared to try her best.  The present evaluation is considered a valid indication of current functioning.  Trail Making Test - [name] completed Part A in 3 minutes 15 seconds. The expected time to completion is 27-39 seconds. [name] performance suggests moderate to severe impairment  [name] was unable to completed Part B. The test was discontinued after 3 minutes. The expected time to completion is 66-85 seconds. [name] performance suggests moderate to severe impairment  Jilda Panda - [name] completed the task in less than 15 minutes. She produced 7 errors: Overlapping Difficulty, Simplification, Retrogression, Perseveration, Motor Incoordination, Angulation Difficulty and Cohesion. This performance is strong evidence for cognitive impairment.  Diagnostic Impression: [name] performance on this neuro-psychological screening suggests moderate to severe cognitive impairment.

## 2016-10-24 MED ORDER — QUETIAPINE FUMARATE 25 MG PO TABS
25.0000 mg | ORAL_TABLET | Freq: Three times a day (TID) | ORAL | Status: DC
  Administered 2016-10-24 – 2016-10-30 (×19): 25 mg via ORAL
  Filled 2016-10-24 (×22): qty 1

## 2016-10-24 MED ORDER — FLUVOXAMINE MALEATE 50 MG PO TABS
150.0000 mg | ORAL_TABLET | Freq: Every day | ORAL | Status: DC
  Administered 2016-10-24 – 2016-10-26 (×3): 150 mg via ORAL
  Filled 2016-10-24 (×3): qty 3

## 2016-10-24 MED ORDER — QUETIAPINE FUMARATE 100 MG PO TABS
100.0000 mg | ORAL_TABLET | Freq: Every day | ORAL | Status: DC
  Administered 2016-10-24 – 2016-10-29 (×6): 100 mg via ORAL
  Filled 2016-10-24 (×3): qty 1

## 2016-10-24 MED ORDER — PAROXETINE HCL 20 MG PO TABS
20.0000 mg | ORAL_TABLET | Freq: Every day | ORAL | Status: DC
  Administered 2016-10-24 – 2016-10-27 (×4): 20 mg via ORAL
  Filled 2016-10-24 (×4): qty 1

## 2016-10-24 NOTE — Plan of Care (Signed)
Problem: Pain Managment: Goal: General experience of comfort will improve Outcome: Progressing No complaints of pain or discomfort

## 2016-10-24 NOTE — Progress Notes (Signed)
Patient continues to verbalize c/o being confused and anxious; and with stating, "I will feel confused and anxious all of my life; I need a place to stay when I leave here; my [name] don't like me." Patient awakened x2; with opening the door to her room; and states, "today is Friday, right. Otherwise, no further complaints were voiced.

## 2016-10-24 NOTE — Progress Notes (Signed)
10/24/16 0930 TC Orinda Kenner, daughter.  CSW followed up at daughter's request regarding pt housing situation.  Orinda Kenner reports that pt's mother and other family members do want pt to find another place to live--pt has been living in her mother's home for some time.  Denton Ar and her mother recently went to "social services" and spoke to a social worker named Otila Kluver who reported that she could assist them in finding housing and also with some other services, such as getting her meds sent to her home.  Denton Ar did not have a phone number with her at this time but said that she is planning to assist her mother in working towards the alternate living situation. CSW told her that pt would soon be ready for discharge and Denton Ar reported that it is OK for pt to return to the mother's home for now and they will pursue this. Lurline Idol, LCSW

## 2016-10-24 NOTE — Plan of Care (Signed)
Problem: Safety: Goal: Ability to remain free from injury will improve Outcome: Progressing No injury reported or observed   

## 2016-10-24 NOTE — Progress Notes (Signed)
Patient discharged home. DC instructions provided and  explained. Medications reviewed. Rx given. 7 day supply meds given. Belongings returned. Transition and Risk assessment given with discharge paperwork. Denies SI, HI, AVH. Pt stable at discharge.

## 2016-10-24 NOTE — Progress Notes (Signed)
Patient pleasant and cooperative. Denies SI, HI, AVH. Endorses depression and confusion. Med and group compliant. Interacts appropriately with staff and peers. Encouragement and support offered. Medications given as prescribed. Safety checks maintained. Pt receptive and remains safe on unit with q 15 min checks.

## 2016-10-24 NOTE — Progress Notes (Signed)
Recreation Therapy Notes  Date: 01.12.18 Time: 9:30 am Location: Craft Room  Group Topic: Stress Management  Goal Area(s) Addresses:  Patient will participate in relaxation techniques. Patient will verbalize benefit of using relaxation techniques.  Behavioral Response: Attentive  Intervention: Relaxation Techniques  Activity: LRT educated and provided patients handouts on with relaxation techniques. Patients practiced the relaxation techniques.  Education: LRT educated patients on why the relaxation techniques are important.  Education Outcome: In group clarification offered  Clinical Observations/Feedback: Patient participated in relaxation techniques. Patient wrote a description of her surroundings. Patient did not contribute to group discussion.  Leonette Monarch, LRT/CTRS 10/24/2016 10:10 AM

## 2016-10-24 NOTE — Progress Notes (Addendum)
[facility] MD Progress Note  10/24/2016 1:03 PM TYQUASIA PANT  MRN:  [id number]  Subjective:   1//2018. Ms. Shein met with treatment team today. She reports absolutely no improvement since admission. She still complains of feeling very anxious and confused. When asked what she is confused about she is not able to answer. This is a striking change from her previous level of functioning as she works as a Scientist, water quality at [employer]. She is fidgety and rather disorganized on the interview. She tolerates medications and accepts them well. Sleep and appetite are okay. She has not been in contact with her family. She is very worried about returning to her mother's house. The patient has severe OCD and her mother is tired of dictation organizing her household. She denies any symptoms of UTI and has been taking Keflex for it. Urine culture was contaminated. The patient goes to groups and has been able to participate productively. We will order a head CT scan and neurocognitive testing to clarify diagnosis.  10/22/2016. Ms. Royle feels very anxious today again. First thing in the morning she was asking for anxiety medication. It is true that she has been on Xanax for many years. She discontinued Xanax 5 days prior to admission and has been in the hospital for 6 days already. She should not be having physical symptoms of withdrawal. We would not recommend benzodiazepines at this point as there are considerable worries about cognitive decline. She met with our psychologist today to complete neuropsych testing. As she was diagnosed with mild to moderate cognitive impairment. Yesterday we change her Risperdal to Zyprexa in hopes that some of her symptoms were due to Risperdal side effects. The patient does not report any somatic symptoms. She takes medications as prescribed but she is not getting better.  10/23/2016. Ms. Vandervoort is a patch better today. She is showered and very nicely dressed. For the first time we are able to have a  pretty decent conversation. She remembered that she gave urine son called this morning. He had urine culture was unsuccessful and she was given Nexium and she was given Keflex ex juvantibus to treat urinary tract infection. Her urine is clear now. The patient still reports foul smell of urine. She reports feeling shaky but is not as preoccupied with family conflict. She does not believe she could return to work or driving today. She accepts medications and tolerates them well.  10/24/2016. Ms. Golay still feels overly confused and very anxious about returning home to her responsibilities. She complains of feeling shaky. She had difficulties finding her room today. Her family went to Social Services to inquire about their options in placing the patient. She accepts medications and tolerates them well I will switch Zyprexa to Seroquel tonight and even low-dose Seroquel for anxiety.  Per nursing: Patient continues to verbalize c/o being confused and anxious; and with stating, "I will feel confused and anxious all of my life; I need a place to stay when I leave here; my [name] don't like me." Patient awakened x2; with opening the door to her room; and states, "today is Friday, right. Otherwise, no further complaints were voiced.  Principal Problem: Severe recurrent major depression without psychotic features ([place]) Diagnosis:   Patient Active Problem List   Diagnosis Date Noted  . UTI (urinary tract infection) [N39.0] 10/17/2016  . OCD (obsessive compulsive disorder) [F42.9] 10/17/2016  . Severe recurrent major depression without psychotic features ([place]) [F33.2] 10/16/2016  . Hypertension [I10] 10/16/2016   Total Time spent  with patient: 20 minutes  Past Psychiatric History: depression.  Past Medical History:  Past Medical History:  Diagnosis Date  . Depression   . Hyperlipemia   . Hypertension   . Skin cancer     Past Surgical History:  Procedure Laterality Date  . CESAREAN SECTION     x2    Family History: History reviewed. No pertinent family history. Family Psychiatric  History: See H&P. Social History:  History  Alcohol Use No     History  Drug Use No    Social History   Social History  . Marital status: Single    Spouse name: N/A  . Number of children: N/A  . Years of education: N/A   Social History Main Topics  . Smoking status: Never Smoker  . Smokeless tobacco: Never Used  . Alcohol use No  . Drug use: No  . Sexual activity: Not Asked   Other Topics Concern  . None   Social History Narrative  . None   Additional Social History:                         Sleep: Fair  Appetite:  Fair  Current Medications: Current Facility-Administered Medications  Medication Dose Route Frequency Provider Last Rate Last Dose  . acetaminophen (TYLENOL) tablet 650 mg  650 mg Oral Q6H PRN Gonzella Lex, MD   650 mg at 10/17/16 8676  . alum & mag hydroxide-simeth (MAALOX/MYLANTA) 200-200-20 MG/5ML suspension 30 mL  30 mL Oral Q4H PRN Gonzella Lex, MD      . cephALEXin (KEFLEX) capsule 250 mg  250 mg Oral Q8H Demitrios Molyneux B Mina Babula, MD   250 mg at 10/24/16 0550  . cyanocobalamin ((VITAMIN B-12)) injection 1,000 mcg  1,000 mcg Intramuscular Daily Dillan Candela B Orvetta Danielski, MD   1,000 mcg at 10/24/16 0800  . fluvoxaMINE (LUVOX) tablet 100 mg  100 mg Oral QHS Kirsten Spearing B Aniqa Hare, MD   100 mg at 10/23/16 2149  . hydrOXYzine (ATARAX/VISTARIL) tablet 50 mg  50 mg Oral TID PRN Lenward Chancellor, MD   50 mg at 10/23/16 2152  . Influenza vac split quadrivalent PF (FLUARIX) injection 0.5 mL  0.5 mL Intramuscular Tomorrow-1000 Markeis Allman B Demar Shad, MD      . magnesium hydroxide (MILK OF MAGNESIA) suspension 30 mL  30 mL Oral Daily PRN Gonzella Lex, MD      . OLANZapine (ZYPREXA) tablet 5 mg  5 mg Oral QHS Ali Mohl B Tymika Grilli, MD   5 mg at 10/23/16 2151  . PARoxetine (PAXIL) tablet 40 mg  40 mg Oral Daily Araiyah Cumpton B Tamyra Fojtik, MD   40 mg at 10/24/16 0747  . simvastatin  (ZOCOR) tablet 10 mg  10 mg Oral q1800 Gonzella Lex, MD   10 mg at 10/23/16 1722  . triamterene-hydrochlorothiazide (MAXZIDE-25) 37.5-25 MG per tablet 1 tablet  1 tablet Oral Daily Gonzella Lex, MD   1 tablet at 10/24/16 1950    Lab Results:  Results for orders placed or performed during the hospital encounter of 10/16/16 (from the past 48 hour(s))  Urinalysis, Complete w Microscopic     Status: Abnormal   Collection Time: 10/23/16  9:07 AM  Result Value Ref Range   Color, Urine YELLOW (A) YELLOW   APPearance CLEAR (A) CLEAR   Specific Gravity, Urine 1.012 1.005 - 1.030   pH 6.0 5.0 - 8.0   Glucose, UA NEGATIVE NEGATIVE mg/dL   Hgb urine dipstick NEGATIVE NEGATIVE  Bilirubin Urine NEGATIVE NEGATIVE   Ketones, ur NEGATIVE NEGATIVE mg/dL   Protein, ur NEGATIVE NEGATIVE mg/dL   Nitrite NEGATIVE NEGATIVE   Leukocytes, UA NEGATIVE NEGATIVE   RBC / HPF 0-5 0 - 5 RBC/hpf   WBC, UA 0-5 0 - 5 WBC/hpf   Bacteria, UA NONE SEEN NONE SEEN   Squamous Epithelial / LPF 0-5 (A) NONE SEEN   Mucous PRESENT    Non Squamous Epithelial 0-5 (A) NONE SEEN    Blood Alcohol level:  Lab Results  Component Value Date   ETH <5 55/97/4163    Metabolic Disorder Labs: Lab Results  Component Value Date   HGBA1C 6.0 (H) 10/17/2016   MPG 126 10/17/2016   No results found for: PROLACTIN Lab Results  Component Value Date   CHOL 166 10/17/2016   TRIG 117 10/17/2016   HDL 49 10/17/2016   CHOLHDL 3.4 10/17/2016   VLDL 23 10/17/2016   LDLCALC 94 10/17/2016    Physical Findings: AIMS: Facial and Oral Movements Muscles of Facial Expression: None, normal Lips and Perioral Area: None, normal Jaw: None, normal Tongue: None, normal,Extremity Movements Upper (arms, wrists, hands, fingers): None, normal Lower (legs, knees, ankles, toes): None, normal, Trunk Movements Neck, shoulders, hips: None, normal, Overall Severity Severity of abnormal movements (highest score from questions above): None,  normal Incapacitation due to abnormal movements: None, normal Patient's awareness of abnormal movements (rate only patient's report): No Awareness, Dental Status Current problems with teeth and/or dentures?: No Does patient usually wear dentures?: No  CIWA:    COWS:     Musculoskeletal: Strength & Muscle Tone: within normal limits Gait & Station: normal Patient leans: N/A  Psychiatric Specialty Exam: Physical Exam  Nursing note and vitals reviewed. Review   Review of Systems  Psychiatric/Behavioral: Positive for memory loss. The patient is nervous/anxious.   All other systems reviewed and are negative.   Blood pressure 128/77, pulse 93, temperature 98 F (36.7 C), temperature source Oral, resp. rate 18, height '4\' 9"'  (1.448 m), weight 67.1 kg (148 lb), SpO2 100 %.Body mass index is 32.03 kg/m.  General Appearance: Casual  Eye Contact:  Good  Speech:  Clear and Coherent  Volume:  Normal  Mood:  Anxious, Hopeless and Worthless  Affect:  Blunt  Thought Process:  Disorganized and Descriptions of Associations: Tangential  Orientation:  Full (Time, Place, and Person)  Thought Content:  Paranoid Ideation  Suicidal Thoughts:  No  Homicidal Thoughts:  No  Memory:  Immediate;   Fair Recent;   Fair Remote;   Fair  Judgement:  Impaired  Insight:  Lacking  Psychomotor Activity:  Decreased  Concentration:  Concentration: Poor and Attention Span: Poor  Recall:  Poor  Fund of Knowledge:  Fair  Language:  Fair  Akathisia:  No  Handed:  Right  AIMS (if indicated):     Assets:  Communication Skills Desire for Improvement Housing Physical Health Resilience Social Support Vocational/Educational  ADL's:  Intact  Cognition:  WNL  Sleep:  Number of Hours: 5     Treatment Plan Summary: Daily contact with patient to assess and evaluate symptoms and progress in treatment and Medication management   Ms. Oshiro is a 61 year old female with a history of depression admitted after  presumed suicide attempt, that the patient denies, confusion and thought disorganization.   1. Suicidal ideation. The patient adamantly denies any thoughts, intentions, or plans to hurt herself or others.   2. Mood. She is on Paxil 20 mg being switched  to Luvox 150 mg for depression and OCD. We discontinued Zyprexa and give Seroquel 100 mg at night and 25 mg tid.  3. Anxiety. Vistaril is available.    4. Insomnia. She is on Seroquel now.  5. HTN. She is on Maxzide.  6. Dyslipidemia. She is on Zocor.  7. Family conflict. Family meeting would be helpful.  8. Cognitive decline. Head CT scan shows mild small vessel disease. Neurocognitive testing shows mild to moderate cognitive decline. See Psychological consultation. RPR and HIV negative.   9. UTI. Urine culture was contaminated. She is on Keflex.  10. Disposition. The patient will be discharged to home. She will follow up with her PCP. She needs psychiatry appointment.   Orson Slick, MD 10/24/2016, 1:03 PM

## 2016-10-24 NOTE — [facility] Group Notes (Signed)
[place] LCSW Group Therapy   10/24/2016 1 PM   Type of Therapy: Group Therapy   Participation Level: Active   Participation Quality: Attentive, Sharing and Supportive   Affect: Appropriate   Cognitive: Alert and Oriented   Insight: Developing/Improving and Engaged   Engagement in Therapy: Developing/Improving and Engaged   Modes of Intervention: Clarification, Confrontation, Discussion, Education, Exploration, Limit-setting, Orientation, Problem-solving, Rapport Building, Art therapist, Socialization and Support   Summary of Progress/Problems: The topic for today was feelings about relapse. Pt discussed what relapse prevention is to them and identified triggers that they are on the path to relapse. Pt processed their feeling towards relapse and was able to relate to peers. Pt discussed coping skills that can be used for relapse prevention. Pt defined relapse as "not succeeding in an area you were improving." She stated that worrying, poor diet, and crying spells are symptoms that become difficult to ignore when experiencing a relapse. She identified spending more time with family as a way to decrease relapse and actively engage in recovery.   Glorious Peach, MSW, [place] 10/24/2016, 2:14 PM

## 2016-10-24 NOTE — Progress Notes (Signed)
Pt isolative to room and unit. Reports confusion. Denies SI, HI, AVH. No negative behaviors noted. Medication compliant.  Encouragement and support offered. Safety checks maintained. Pt remains safe on unit with q 15 min checks.

## 2016-10-25 NOTE — [facility] Group Notes (Signed)
[place] LCSW Group Therapy  Jan 13th/18 1pm Type of Therapy: Group Therapy Participation Level: Active  Participation Quality: Attentive, Sharing and Supportive  Affect: Appropriate  Cognitive: Alert and Oriented  Insight: Developing/Improving and Engaged  Engagement in Therapy: Developing/Improving and Engaged  Modes of Intervention: Clarification, Confrontation, Discussion, Education, Exploration, Limit-setting, Orientation, Problem-solving, Rapport Building, Art therapist, Socialization and Support  Summary of Progress/Problems: The topic for today was feelings related to overcoming obstacles. Patient were asked to introduce himself or herself and reflect an obstacle they face. In group peers supported  each other and shared ideas on how to resolve these obstacles. This patient was able to discuss family as her obstacle at times. Enis Slipper, [facility] LCSW  10/26/2015 1pm

## 2016-10-25 NOTE — Progress Notes (Signed)
Affect cheerful on approach . Patient interacting  with her peers . Appetite good , voice no concerns around asleep. Patient  Noted to keep up with her ADL"s  Daily and personal chores . Denies any hallucinations , normal gait . Thought process  Remains   Confused at times . Continue to voice of being nervous . Quick to request an pill for her  Anxiety. Explained  To patient to deep breath , use coping skills . Patient needing additional help . Patient denies  Depression  A: Encourage patient participation with unit programming . Instruction  Given on  Medication , verbalize understanding. R: Voice no other concerns. Staff continue to monitor

## 2016-10-25 NOTE — [facility] Group Notes (Signed)
[facility] Group Notes:  (Nursing/MHT/Case Management/Adjunct) 10/24/16 1500  Type of Therapy:  Music Therapy  Participation Level:  Active  Participation Quality:  Appropriate  Affect:  Appropriate  Cognitive:  Appropriate  Insight:  Appropriate  Engagement in Group:  Engaged  Modes of Intervention:  Activity, Discussion and Education  Summary of Progress/Problems:  Coralie Common 10/25/2016, 1:29 PM

## 2016-10-25 NOTE — Plan of Care (Signed)
Problem: Coping: Goal: Ability to verbalize frustrations and anger appropriately will improve Outcome: Progressing Continue to work on [employer]

## 2016-10-25 NOTE — Progress Notes (Signed)
[facility] MD Progress Note  10/25/2016 1:45 PM Kelsey Arroyo  MRN:  [id number]  Subjective:   1//2018. Kelsey Arroyo met with treatment team today. She reports absolutely no improvement since admission. She still complains of feeling very anxious and confused. When asked what she is confused about she is not able to answer. This is a striking change from her previous level of functioning as she works as a Scientist, water quality at [employer]. She is fidgety and rather disorganized on the interview. She tolerates medications and accepts them well. Sleep and appetite are okay. She has not been in contact with her family. She is very worried about returning to her mother's house. The patient has severe OCD and her mother is tired of dictation organizing her household. She denies any symptoms of UTI and has been taking Keflex for it. Urine culture was contaminated. The patient goes to groups and has been able to participate productively. We will order a head CT scan and neurocognitive testing to clarify diagnosis.  10/22/2016. Kelsey Arroyo feels very anxious today again. First thing in the morning she was asking for anxiety medication. It is true that she has been on Xanax for many years. She discontinued Xanax 5 days prior to admission and has been in the hospital for 6 days already. She should not be having physical symptoms of withdrawal. We would not recommend benzodiazepines at this point as there are considerable worries about cognitive decline. She met with our psychologist today to complete neuropsych testing. As she was diagnosed with mild to moderate cognitive impairment. Yesterday we change her Risperdal to Zyprexa in hopes that some of her symptoms were due to Risperdal side effects. The patient does not report any somatic symptoms. She takes medications as prescribed but she is not getting better.  10/23/2016. Kelsey Arroyo is a patch better today. She is showered and very nicely dressed. For the first time we are able to have a  pretty decent conversation. She remembered that she gave urine son called this morning. He had urine culture was unsuccessful and she was given Nexium and she was given Keflex ex juvantibus to treat urinary tract infection. Her urine is clear now. The patient still reports foul smell of urine. She reports feeling shaky but is not as preoccupied with family conflict. She does not believe she could return to work or driving today. She accepts medications and tolerates them well.  10/24/2016. Kelsey Arroyo still feels overly confused and very anxious about returning home to her responsibilities. She complains of feeling shaky. She had difficulties finding her room today. Her family went to Social Services to inquire about their options in placing the patient. She accepts medications and tolerates them well I will switch Zyprexa to Seroquel tonight and even low-dose Seroquel for anxiety.  Follow-up for January 13. Patient seen. Chart reviewed. Note from Dr. Mamie Nick reviewed. Patient says that she subjectively feels confused but denies being depressed denies suicidal thoughts. Patient is adequately groomed. She stays mostly to herself in her room looking at the newspaper. Not reporting any violent thoughts. No suicidal ideation.  Per nursing: Patient continues to verbalize c/o being confused and anxious; and with stating, "I will feel confused and anxious all of my life; I need a place to stay when I leave here; my [name] don't like me." Patient awakened x2; with opening the door to her room; and states, "today is Friday, right. Otherwise, no further complaints were voiced.  Principal Problem: Severe recurrent major depression without psychotic  features [facility]) Diagnosis:   Patient Active Problem List   Diagnosis Date Noted  . UTI (urinary tract infection) [N39.0] 10/17/2016  . OCD (obsessive compulsive disorder) [F42.9] 10/17/2016  . Severe recurrent major depression without psychotic features ([place]) [F33.2] 10/16/2016   . Hypertension [I10] 10/16/2016   Total Time spent with patient: 20 minutes  Past Psychiatric History: depression.  Past Medical History:  Past Medical History:  Diagnosis Date  . Depression   . Hyperlipemia   . Hypertension   . Skin cancer     Past Surgical History:  Procedure Laterality Date  . CESAREAN SECTION     x2   Family History: History reviewed. No pertinent family history. Family Psychiatric  History: See H&P. Social History:  History  Alcohol Use No     History  Drug Use No    Social History   Social History  . Marital status: Single    Spouse name: N/A  . Number of children: N/A  . Years of education: N/A   Social History Main Topics  . Smoking status: Never Smoker  . Smokeless tobacco: Never Used  . Alcohol use No  . Drug use: No  . Sexual activity: Not Asked   Other Topics Concern  . None   Social History Narrative  . None   Additional Social History:                         Sleep: Fair  Appetite:  Fair  Current Medications: Current Facility-Administered Medications  Medication Dose Route Frequency Provider Last Rate Last Dose  . acetaminophen (TYLENOL) tablet 650 mg  650 mg Oral Q6H PRN Gonzella Lex, MD   650 mg at 10/17/16 6045  . alum & mag hydroxide-simeth (MAALOX/MYLANTA) 200-200-20 MG/5ML suspension 30 mL  30 mL Oral Q4H PRN Gonzella Lex, MD      . cephALEXin (KEFLEX) capsule 250 mg  250 mg Oral Q8H Jolanta B Pucilowska, MD   250 mg at 10/25/16 0829  . cyanocobalamin ((VITAMIN B-12)) injection 1,000 mcg  1,000 mcg Intramuscular Daily Clovis Fredrickson, MD   1,000 mcg at 10/25/16 0829  . fluvoxaMINE (LUVOX) tablet 150 mg  150 mg Oral QHS Jolanta B Pucilowska, MD   150 mg at 10/24/16 2141  . hydrOXYzine (ATARAX/VISTARIL) tablet 50 mg  50 mg Oral TID PRN Lenward Chancellor, MD   50 mg at 10/23/16 2152  . Influenza vac split quadrivalent PF (FLUARIX) injection 0.5 mL  0.5 mL Intramuscular Tomorrow-1000 Jolanta B  Pucilowska, MD      . magnesium hydroxide (MILK OF MAGNESIA) suspension 30 mL  30 mL Oral Daily PRN Gonzella Lex, MD      . PARoxetine (PAXIL) tablet 20 mg  20 mg Oral Daily Jolanta B Pucilowska, MD   20 mg at 10/25/16 0829  . QUEtiapine (SEROQUEL) tablet 100 mg  100 mg Oral QHS Clovis Fredrickson, MD   100 mg at 10/24/16 2141  . QUEtiapine (SEROQUEL) tablet 25 mg  25 mg Oral TID Clovis Fredrickson, MD   25 mg at 10/25/16 1210  . simvastatin (ZOCOR) tablet 10 mg  10 mg Oral q1800 Gonzella Lex, MD   10 mg at 10/24/16 1654  . triamterene-hydrochlorothiazide (MAXZIDE-25) 37.5-25 MG per tablet 1 tablet  1 tablet Oral Daily Gonzella Lex, MD   1 tablet at 10/25/16 4098    Lab Results:  No results found for this or any previous visit (  from the past 48 hour(s)).  Blood Alcohol level:  Lab Results  Component Value Date   ETH <5 95/18/8416    Metabolic Disorder Labs: Lab Results  Component Value Date   HGBA1C 6.0 (H) 10/17/2016   MPG 126 10/17/2016   No results found for: PROLACTIN Lab Results  Component Value Date   CHOL 166 10/17/2016   TRIG 117 10/17/2016   HDL 49 10/17/2016   CHOLHDL 3.4 10/17/2016   VLDL 23 10/17/2016   LDLCALC 94 10/17/2016    Physical Findings: AIMS: Facial and Oral Movements Muscles of Facial Expression: None, normal Lips and Perioral Area: None, normal Jaw: None, normal Tongue: None, normal,Extremity Movements Upper (arms, wrists, hands, fingers): None, normal Lower (legs, knees, ankles, toes): None, normal, Trunk Movements Neck, shoulders, hips: None, normal, Overall Severity Severity of abnormal movements (highest score from questions above): None, normal Incapacitation due to abnormal movements: None, normal Patient's awareness of abnormal movements (rate only patient's report): No Awareness, Dental Status Current problems with teeth and/or dentures?: No Does patient usually wear dentures?: No  CIWA:    COWS:      Musculoskeletal: Strength & Muscle Tone: within normal limits Gait & Station: normal Patient leans: N/A  Psychiatric Specialty Exam: Physical Exam  Nursing note and vitals reviewed. Review   Review of Systems  Psychiatric/Behavioral: Positive for memory loss. The patient is nervous/anxious.   All other systems reviewed and are negative.   Blood pressure 132/73, pulse 87, temperature 98.5 F (36.9 C), resp. rate 18, height 4' 9" (1.448 m), weight 67.1 kg (148 lb), SpO2 100 %.Body mass index is 32.03 kg/m.  General Appearance: Casual  Eye Contact:  Good  Speech:  Clear and Coherent  Volume:  Normal  Mood:  Anxious, Hopeless and Worthless  Affect:  Blunt  Thought Process:  Disorganized and Descriptions of Associations: Tangential  Orientation:  Full (Time, Place, and Person)  Thought Content:  Paranoid Ideation  Suicidal Thoughts:  No  Homicidal Thoughts:  No  Memory:  Immediate;   Fair Recent;   Fair Remote;   Fair  Judgement:  Impaired  Insight:  Lacking  Psychomotor Activity:  Decreased  Concentration:  Concentration: Poor and Attention Span: Poor  Recall:  Poor  Fund of Knowledge:  Fair  Language:  Fair  Akathisia:  No  Handed:  Right  AIMS (if indicated):     Assets:  Communication Skills Desire for Improvement Housing Physical Health Resilience Social Support Vocational/Educational  ADL's:  Intact  Cognition:  WNL  Sleep:  Number of Hours: 8     Treatment Plan Summary: Daily contact with patient to assess and evaluate symptoms and progress in treatment and Medication management   Ms. Degner is a 61 year old female with a history of depression admitted after presumed suicide attempt, that the patient denies, confusion and thought disorganization.   1. Suicidal ideation. The patient adamantly denies any thoughts, intentions, or plans to hurt herself or others.   2. Mood. She is on Paxil 20 mg being switched to Luvox 150 mg for depression and OCD. We  discontinued Zyprexa and give Seroquel 100 mg at night and 25 mg tid.  3. Anxiety. Vistaril is available.    4. Insomnia. She is on Seroquel now.  5. HTN. She is on Maxzide.  6. Dyslipidemia. She is on Zocor.  7. Family conflict. Family meeting would be helpful.  8. Cognitive decline. Head CT scan shows mild small vessel disease. Neurocognitive testing shows mild to moderate cognitive  decline. See Psychological consultation. RPR and HIV negative.   9. UTI. Urine culture was contaminated. She is on Keflex.  10. Disposition. The patient will be discharged to home. She will follow up with her PCP. She needs psychiatry appointment.   Patient appears to be about the same as previously with subjective complaints of confusion and anxiety although she is not delirious and is basically cooperative and getting along without needing one to one observation. Affect is a little constricted but not bizarre. Denies suicidal thoughts. Supportive counseling. No change to medicine.  Alethia Berthold, MD 10/25/2016, 1:45 PM

## 2016-10-26 NOTE — Plan of Care (Signed)
Problem: Activity: Goal: Interest or engagement in activities will improve Outcome: Progressing Pt has been attending groups.

## 2016-10-26 NOTE — [facility] Group Notes (Signed)
[facility] LCSW Group Therapy Note  Date/Time Jan 14th,2018 1pm Type of Therapy/Topic: Group Therapy: Balance in Life  Participation Level: Attentive, reflective Description of Group: Balance in Life This group will address the concept of balance and how it feels and looks when one is unbalanced. Patients will be encouraged to process areas in their lives that are out of balance, and identify reasons for remaining unbalanced. Facilitators will guide patients utilizing problem- solving interventions to address and correct the stressor making their life unbalanced. Understanding and applying boundaries will be explored and addressed for obtaining and maintaining a balanced life. Patients will be encouraged to explore ways to assertively make their unbalanced needs known to significant others in their lives, using other group members and facilitator for support and feedback.  Therapeutic Goals:  1. Patient will identify two or more emotions or situations they have that consume much of in their lives.  2. Patient will identify signs/triggers that life has become out of balance:  3. Patient will identify two ways to set boundaries in order to achieve balance in their lives:  4. Patient will demonstrate ability to communicate their needs through discussion and/or role plays   Summary of Patient Progress: This patient was able to identify that sleep disturbances and not taking her medications as prescribed would cause in balance in her life. She was able to share experiences and support her peers. Therapeutic Modalities:  Cognitive Behavioral Therapy  Solution-Focused Therapy  Assertiveness Training

## 2016-10-26 NOTE — Progress Notes (Signed)
Denies SI/HI/AVH. Attended evening group. C/o confusion. Appropriate during interaction with staff and peers. Becomes "lost" on unit requiring reorientation to unit at times. Denies pain. Medication compliant. Voices no additional concerns at this time. Will continue to monitor.

## 2016-10-26 NOTE — Progress Notes (Signed)
[facility] MD Progress Note  10/26/2016 1:47 PM Kelsey Arroyo  MRN:  [id number]  Subjective:   1//2018. Ms. Kelsey Arroyo met with treatment team today. She reports absolutely no improvement since admission. She still complains of feeling very anxious and confused. When asked what she is confused about she is not able to answer. This is a striking change from her previous level of functioning as she works as a Scientist, water quality at [employer]. She is fidgety and rather disorganized on the interview. She tolerates medications and accepts them well. Sleep and appetite are okay. She has not been in contact with her family. She is very worried about returning to her mother's house. The patient has severe OCD and her mother is tired of dictation organizing her household. She denies any symptoms of UTI and has been taking Keflex for it. Urine culture was contaminated. The patient goes to groups and has been able to participate productively. We will order a head CT scan and neurocognitive testing to clarify diagnosis.  10/22/2016. Ms. Kelsey Arroyo feels very anxious today again. First thing in the morning she was asking for anxiety medication. It is true that she has been on Xanax for many years. She discontinued Xanax 5 days prior to admission and has been in the hospital for 6 days already. She should not be having physical symptoms of withdrawal. We would not recommend benzodiazepines at this point as there are considerable worries about cognitive decline. She met with our psychologist today to complete neuropsych testing. As she was diagnosed with mild to moderate cognitive impairment. Yesterday we change her Risperdal to Zyprexa in hopes that some of her symptoms were due to Risperdal side effects. The patient does not report any somatic symptoms. She takes medications as prescribed but she is not getting better.  10/23/2016. Ms. Kelsey Arroyo is a patch better today. She is showered and very nicely dressed. For the first time we are able to have a  pretty decent conversation. She remembered that she gave urine son called this morning. He had urine culture was unsuccessful and she was given Nexium and she was given Keflex ex juvantibus to treat urinary tract infection. Her urine is clear now. The patient still reports foul smell of urine. She reports feeling shaky but is not as preoccupied with family conflict. She does not believe she could return to work or driving today. She accepts medications and tolerates them well.  10/24/2016. Ms. Kelsey Arroyo still feels overly confused and very anxious about returning home to her responsibilities. She complains of feeling shaky. She had difficulties finding her room today. Her family went to Social Services to inquire about their options in placing the patient. She accepts medications and tolerates them well I will switch Zyprexa to Seroquel tonight and even low-dose Seroquel for anxiety.  Follow-up for January 13. Patient seen. Chart reviewed. Note from Dr. Mamie Nick reviewed. Patient says that she subjectively feels confused but denies being depressed denies suicidal thoughts. Patient is adequately groomed. She stays mostly to herself in her room looking at the newspaper. Not reporting any violent thoughts. No suicidal ideation.  Follow-up January 14. Patient seen. Chart reviewed. Patient continues to describe herself as "confused" although I found that she was alert and oriented 4 and actually able to describe her situation and history reasonably well. She says her mood is feeling better. I wonder if some of her "confusion" is now a way to avoid unpleasant family situation. Stays mostly to herself but affect is euthymic and her health appears  to be stable.  Per nursing: Patient continues to verbalize c/o being confused and anxious; and with stating, "I will feel confused and anxious all of my life; I need a place to stay when I leave here; my [name] don't like me." Patient awakened x2; with opening the door to her room;  and states, "today is Friday, right. Otherwise, no further complaints were voiced.  Principal Problem: Severe recurrent major depression without psychotic features ([place]) Diagnosis:   Patient Active Problem List   Diagnosis Date Noted  . UTI (urinary tract infection) [N39.0] 10/17/2016  . OCD (obsessive compulsive disorder) [F42.9] 10/17/2016  . Severe recurrent major depression without psychotic features ([place]) [F33.2] 10/16/2016  . Hypertension [I10] 10/16/2016   Total Time spent with patient: 20 minutes  Past Psychiatric History: depression.  Past Medical History:  Past Medical History:  Diagnosis Date  . Depression   . Hyperlipemia   . Hypertension   . Skin cancer     Past Surgical History:  Procedure Laterality Date  . CESAREAN SECTION     x2   Family History: History reviewed. No pertinent family history. Family Psychiatric  History: See H&P. Social History:  History  Alcohol Use No     History  Drug Use No    Social History   Social History  . Marital status: Single    Spouse name: N/A  . Number of children: N/A  . Years of education: N/A   Social History Main Topics  . Smoking status: Never Smoker  . Smokeless tobacco: Never Used  . Alcohol use No  . Drug use: No  . Sexual activity: Not Asked   Other Topics Concern  . None   Social History Narrative  . None   Additional Social History:                         Sleep: Fair  Appetite:  Fair  Current Medications: Current Facility-Administered Medications  Medication Dose Route Frequency Provider Last Rate Last Dose  . acetaminophen (TYLENOL) tablet 650 mg  650 mg Oral Q6H PRN Gonzella Lex, MD   650 mg at 10/17/16 6256  . alum & mag hydroxide-simeth (MAALOX/MYLANTA) 200-200-20 MG/5ML suspension 30 mL  30 mL Oral Q4H PRN Gonzella Lex, MD      . cephALEXin (KEFLEX) capsule 250 mg  250 mg Oral Q8H Jolanta B Pucilowska, MD   250 mg at 10/26/16 0838  . cyanocobalamin ((VITAMIN B-12))  injection 1,000 mcg  1,000 mcg Intramuscular Daily Clovis Fredrickson, MD   1,000 mcg at 10/26/16 0838  . fluvoxaMINE (LUVOX) tablet 150 mg  150 mg Oral QHS Clovis Fredrickson, MD   150 mg at 10/25/16 2148  . hydrOXYzine (ATARAX/VISTARIL) tablet 50 mg  50 mg Oral TID PRN Lenward Chancellor, MD   50 mg at 10/25/16 1454  . Influenza vac split quadrivalent PF (FLUARIX) injection 0.5 mL  0.5 mL Intramuscular Tomorrow-1000 Jolanta B Pucilowska, MD      . magnesium hydroxide (MILK OF MAGNESIA) suspension 30 mL  30 mL Oral Daily PRN Gonzella Lex, MD      . PARoxetine (PAXIL) tablet 20 mg  20 mg Oral Daily Jolanta B Pucilowska, MD   20 mg at 10/26/16 0838  . QUEtiapine (SEROQUEL) tablet 100 mg  100 mg Oral QHS Jolanta B Pucilowska, MD   100 mg at 10/25/16 2150  . QUEtiapine (SEROQUEL) tablet 25 mg  25 mg Oral TID Jolanta  Vevelyn Francois, MD   25 mg at 10/26/16 0838  . simvastatin (ZOCOR) tablet 10 mg  10 mg Oral q1800 Gonzella Lex, MD   10 mg at 10/25/16 1705  . triamterene-hydrochlorothiazide (MAXZIDE-25) 37.5-25 MG per tablet 1 tablet  1 tablet Oral Daily Gonzella Lex, MD   1 tablet at 10/26/16 2355    Lab Results:  No results found for this or any previous visit (from the past 48 hour(s)).  Blood Alcohol level:  Lab Results  Component Value Date   ETH <5 73/22/0254    Metabolic Disorder Labs: Lab Results  Component Value Date   HGBA1C 6.0 (H) 10/17/2016   MPG 126 10/17/2016   No results found for: PROLACTIN Lab Results  Component Value Date   CHOL 166 10/17/2016   TRIG 117 10/17/2016   HDL 49 10/17/2016   CHOLHDL 3.4 10/17/2016   VLDL 23 10/17/2016   LDLCALC 94 10/17/2016    Physical Findings: AIMS: Facial and Oral Movements Muscles of Facial Expression: None, normal Lips and Perioral Area: None, normal Jaw: None, normal Tongue: None, normal,Extremity Movements Upper (arms, wrists, hands, fingers): None, normal Lower (legs, knees, ankles, toes): None, normal, Trunk  Movements Neck, shoulders, hips: None, normal, Overall Severity Severity of abnormal movements (highest score from questions above): None, normal Incapacitation due to abnormal movements: None, normal Patient's awareness of abnormal movements (rate only patient's report): No Awareness, Dental Status Current problems with teeth and/or dentures?: No Does patient usually wear dentures?: No  CIWA:    COWS:     Musculoskeletal: Strength & Muscle Tone: within normal limits Gait & Station: normal Patient leans: N/A  Psychiatric Specialty Exam: Physical Exam  Nursing note and vitals reviewed. Review   Review of Systems  Psychiatric/Behavioral: Positive for memory loss. The patient is nervous/anxious.   All other systems reviewed and are negative.   Blood pressure 139/83, pulse 96, temperature 98.4 F (36.9 C), temperature source Oral, resp. rate 20, height '4\' 9"'  (1.448 m), weight 67.1 kg (148 lb), SpO2 100 %.Body mass index is 32.03 kg/m.  General Appearance: Casual  Eye Contact:  Good  Speech:  Clear and Coherent  Volume:  Normal  Mood:  Anxious, Hopeless and Worthless  Affect:  Blunt  Thought Process:  Disorganized and Descriptions of Associations: Tangential  Orientation:  Full (Time, Place, and Person)  Thought Content:  Paranoid Ideation  Suicidal Thoughts:  No  Homicidal Thoughts:  No  Memory:  Immediate;   Fair Recent;   Fair Remote;   Fair  Judgement:  Impaired  Insight:  Lacking  Psychomotor Activity:  Decreased  Concentration:  Concentration: Poor and Attention Span: Poor  Recall:  Poor  Fund of Knowledge:  Fair  Language:  Fair  Akathisia:  No  Handed:  Right  AIMS (if indicated):     Assets:  Communication Skills Desire for Improvement Housing Physical Health Resilience Social Support Vocational/Educational  ADL's:  Intact  Cognition:  WNL  Sleep:  Number of Hours: 7.3     Treatment Plan Summary: Daily contact with patient to assess and evaluate  symptoms and progress in treatment and Medication management   Ms. Fackrell is a 61 year old female with a history of depression admitted after presumed suicide attempt, that the patient denies, confusion and thought disorganization.   1. Suicidal ideation. The patient adamantly denies any thoughts, intentions, or plans to hurt herself or others.   2. Mood. She is on Paxil 20 mg being switched to Luvox 150  mg for depression and OCD. We discontinued Zyprexa and give Seroquel 100 mg at night and 25 mg tid.  3. Anxiety. Vistaril is available.    4. Insomnia. She is on Seroquel now.  5. HTN. She is on Maxzide.  6. Dyslipidemia. She is on Zocor.  7. Family conflict. Family meeting would be helpful.  8. Cognitive decline. Head CT scan shows mild small vessel disease. Neurocognitive testing shows mild to moderate cognitive decline. See Psychological consultation. RPR and HIV negative.   9. UTI. Urine culture was contaminated. She is on Keflex.  10. Disposition. The patient will be discharged to home. She will follow up with her PCP. She needs psychiatry appointment.   No change to treatment plan. No change to medicine. Supportive counseling and encouragement to the patient. May be getting close to discharge. Alethia Berthold, MD 10/26/2016, 1:47 PM

## 2016-10-26 NOTE — Plan of Care (Signed)
Problem: Activity: Goal: Sleeping patterns will improve Outcome: Progressing Patient reports resting well. Pt slept 7 hrs 30 min.

## 2016-10-26 NOTE — [facility] Group Notes (Signed)
[facility] Group Notes:  (Nursing/MHT/Case Management/Adjunct)  Date:  10/26/2016  Time:  4:59 AM  Type of Therapy:  Psychoeducational Skills  Participation Level:  Active  Participation Quality:  Appropriate  Affect:  Appropriate  Cognitive:  Appropriate  Insight:  Appropriate  Engagement in Group:  Engaged  Modes of Intervention:  Discussion, Socialization and Support  Summary of Progress/Problems:  Kelsey Arroyo 10/26/2016, 4:59 AM

## 2016-10-26 NOTE — Progress Notes (Signed)
Patient pleasant and cooperative. Reports being confused today. Pt needs redirection to room. No other confusion noted. Denies SI, HI, AVH, endorses depression. Reports eating and sleeping well. Encouragement and support offered. Medications given as prescribed.  Safety checks maintained. Pt receptive, appropriate with staff and peers. Medication and group compliant. Remains safe on unit with q 15 min checks.

## 2016-10-27 DIAGNOSIS — E538 Deficiency of other specified B group vitamins: Secondary | ICD-10-CM | POA: Diagnosis present

## 2016-10-27 MED ORDER — VITAMIN B-12 1000 MCG PO TABS
1000.0000 ug | ORAL_TABLET | Freq: Every day | ORAL | Status: DC
  Administered 2016-10-28 – 2016-10-30 (×3): 1000 ug via ORAL
  Filled 2016-10-27 (×3): qty 1

## 2016-10-27 MED ORDER — TRIAMTERENE-HCTZ 37.5-25 MG PO TABS: 1.0000 | ORAL_TABLET | Freq: Every day | ORAL | 1 refills | Status: DC

## 2016-10-27 MED ORDER — CYANOCOBALAMIN 1000 MCG PO TABS: 1000.0000 ug | ORAL_TABLET | Freq: Every day | ORAL | 1 refills | Status: DC

## 2016-10-27 MED ORDER — FLUVOXAMINE MALEATE 100 MG PO TABS: 200.0000 mg | ORAL_TABLET | Freq: Every day | ORAL | 1 refills | Status: DC

## 2016-10-27 MED ORDER — QUETIAPINE FUMARATE 100 MG PO TABS: 100.0000 mg | ORAL_TABLET | Freq: Every day | ORAL | 1 refills | Status: DC

## 2016-10-27 MED ORDER — QUETIAPINE FUMARATE 25 MG PO TABS: 25.0000 mg | ORAL_TABLET | Freq: Three times a day (TID) | ORAL | 1 refills | Status: DC

## 2016-10-27 MED ORDER — HYDROXYZINE HCL 50 MG PO TABS: 50.0000 mg | ORAL_TABLET | Freq: Three times a day (TID) | ORAL | 1 refills | Status: DC | PRN

## 2016-10-27 MED ORDER — CYANOCOBALAMIN 1000 MCG/ML IJ SOLN: 1000.0000 ug | Freq: Once | INTRAMUSCULAR | Status: DC

## 2016-10-27 MED ORDER — FLUVOXAMINE MALEATE 50 MG PO TABS
200.0000 mg | ORAL_TABLET | Freq: Every day | ORAL | Status: DC
  Administered 2016-10-27 – 2016-10-29 (×3): 200 mg via ORAL
  Filled 2016-10-27 (×3): qty 4

## 2016-10-27 NOTE — Progress Notes (Signed)
[facility] MD Progress Note  10/27/2016 3:12 PM Kelsey Arroyo  MRN:  [id number]  Subjective:   1//2018. Kelsey Arroyo met with treatment team today. She reports absolutely no improvement since admission. She still complains of feeling very anxious and confused. When asked what she is confused about she is not able to answer. This is a striking change from her previous level of functioning as she works as a Scientist, water quality at [employer]. She is fidgety and rather disorganized on the interview. She tolerates medications and accepts them well. Sleep and appetite are okay. She has not been in contact with her family. She is very worried about returning to her mother's house. The patient has severe OCD and her mother is tired of dictation organizing her household. She denies any symptoms of UTI and has been taking Keflex for it. Urine culture was contaminated. The patient goes to groups and has been able to participate productively. We will order a head CT scan and neurocognitive testing to clarify diagnosis.  10/22/2016. Kelsey Arroyo feels very anxious today again. First thing in the morning she was asking for anxiety medication. It is true that she has been on Xanax for many years. She discontinued Xanax 5 days prior to admission and has been in the hospital for 6 days already. She should not be having physical symptoms of withdrawal. We would not recommend benzodiazepines at this point as there are considerable worries about cognitive decline. She met with our psychologist today to complete neuropsych testing. As she was diagnosed with mild to moderate cognitive impairment. Yesterday we change her Risperdal to Zyprexa in hopes that some of her symptoms were due to Risperdal side effects. The patient does not report any somatic symptoms. She takes medications as prescribed but she is not getting better.  10/23/2016. Kelsey Arroyo is a patch better today. She is showered and very nicely dressed. For the first time we are able to have a  pretty decent conversation. She remembered that she gave urine son called this morning. He had urine culture was unsuccessful and she was given Nexium and she was given Keflex ex juvantibus to treat urinary tract infection. Her urine is clear now. The patient still reports foul smell of urine. She reports feeling shaky but is not as preoccupied with family conflict. She does not believe she could return to work or driving today. She accepts medications and tolerates them well.  10/24/2016. Kelsey Arroyo still feels overly confused and very anxious about returning home to her responsibilities. She complains of feeling shaky. She had difficulties finding her room today. Her family went to Social Services to inquire about their options in placing the patient. She accepts medications and tolerates them well I will switch Zyprexa to Seroquel tonight and even low-dose Seroquel for anxiety.  Follow-up for January 13. Patient seen. Chart reviewed. Note from Dr. Mamie Nick reviewed. Patient says that she subjectively feels confused but denies being depressed denies suicidal thoughts. Patient is adequately groomed. She stays mostly to herself in her room looking at the newspaper. Not reporting any violent thoughts. No suicidal ideation.  Follow-up January 14. Patient seen. Chart reviewed. Patient continues to describe herself as "confused" although I found that she was alert and oriented 4 and actually able to describe her situation and history reasonably well. She says her mood is feeling better. I wonder if some of her "confusion" is now a way to avoid unpleasant family situation. Stays mostly to herself but affect is euthymic and her health appears  to be stable.  10/27/2916 Kelsey Arroyo became extremely anxious discussing discharge plan today. She feels "confused and shaky". I was mistaken believing that neuropsychological testing revealed mild to moderate cognitive problems. According to psychologist note it is "moderate to  severe". We will ask OT and speech therapy for additional evaluation to determine her level of functioning. This patient was at work and driving just hours prior to admission.  Per nursing: Patient continues to verbalize c/o being confused and anxious; and with stating, "I will feel confused and anxious all of my life; I need a place to stay when I leave here; my [name] don't like me." Patient awakened x2; with opening the door to her room; and states, "today is Friday, right. Otherwise, no further complaints were voiced.  Principal Problem: Severe recurrent major depression without psychotic features ([place]) Diagnosis:   Patient Active Problem List   Diagnosis Date Noted  . Dementia with behavioral disturbance [F03.91] 10/27/2016  . B12 deficiency [E53.8] 10/27/2016  . UTI (urinary tract infection) [N39.0] 10/17/2016  . OCD (obsessive compulsive disorder) [F42.9] 10/17/2016  . Severe recurrent major depression without psychotic features ([place]) [F33.2] 10/16/2016  . Hypertension [I10] 10/16/2016   Total Time spent with patient: 20 minutes  Past Psychiatric History: depression.  Past Medical History:  Past Medical History:  Diagnosis Date  . Depression   . Hyperlipemia   . Hypertension   . Skin cancer     Past Surgical History:  Procedure Laterality Date  . CESAREAN SECTION     x2   Family History: History reviewed. No pertinent family history. Family Psychiatric  History: See H&P. Social History:  History  Alcohol Use No     History  Drug Use No    Social History   Social History  . Marital status: Single    Spouse name: N/A  . Number of children: N/A  . Years of education: N/A   Social History Main Topics  . Smoking status: Never Smoker  . Smokeless tobacco: Never Used  . Alcohol use No  . Drug use: No  . Sexual activity: Not Asked   Other Topics Concern  . None   Social History Narrative  . None   Additional Social History:  Specify valuables returned:  items at bedside                      Sleep: Fair  Appetite:  Fair  Current Medications: Current Facility-Administered Medications  Medication Dose Route Frequency Provider Last Rate Last Dose  . acetaminophen (TYLENOL) tablet 650 mg  650 mg Oral Q6H PRN Gonzella Lex, MD   650 mg at 10/17/16 7425  . alum & mag hydroxide-simeth (MAALOX/MYLANTA) 200-200-20 MG/5ML suspension 30 mL  30 mL Oral Q4H PRN Gonzella Lex, MD      . fluvoxaMINE (LUVOX) tablet 200 mg  200 mg Oral QHS Zell Hylton B Rykin Route, MD      . hydrOXYzine (ATARAX/VISTARIL) tablet 50 mg  50 mg Oral TID PRN Lenward Chancellor, MD   50 mg at 10/25/16 1454  . magnesium hydroxide (MILK OF MAGNESIA) suspension 30 mL  30 mL Oral Daily PRN Gonzella Lex, MD      . QUEtiapine (SEROQUEL) tablet 100 mg  100 mg Oral QHS Shelba Susi B Konstantinos Cordoba, MD   100 mg at 10/26/16 2300  . QUEtiapine (SEROQUEL) tablet 25 mg  25 mg Oral TID Clovis Fredrickson, MD   25 mg at 10/27/16 1155  . simvastatin (  ZOCOR) tablet 10 mg  10 mg Oral q1800 Gonzella Lex, MD   10 mg at 10/26/16 1642  . triamterene-hydrochlorothiazide (MAXZIDE-25) 37.5-25 MG per tablet 1 tablet  1 tablet Oral Daily Gonzella Lex, MD   1 tablet at 10/27/16 [phone]  . vitamin B-12 (CYANOCOBALAMIN) tablet 1,000 mcg  1,000 mcg Oral Daily Hermie Reagor B Peggye Poon, MD        Lab Results:  No results found for this or any previous visit (from the past 48 hour(s)).  Blood Alcohol level:  Lab Results  Component Value Date   ETH <5 93/81/8299    Metabolic Disorder Labs: Lab Results  Component Value Date   HGBA1C 6.0 (H) 10/17/2016   MPG 126 10/17/2016   No results found for: PROLACTIN Lab Results  Component Value Date   CHOL 166 10/17/2016   TRIG 117 10/17/2016   HDL 49 10/17/2016   CHOLHDL 3.4 10/17/2016   VLDL 23 10/17/2016   LDLCALC 94 10/17/2016    Physical Findings: AIMS: Facial and Oral Movements Muscles of Facial Expression: None, normal Lips and Perioral Area:  None, normal Jaw: None, normal Tongue: None, normal,Extremity Movements Upper (arms, wrists, hands, fingers): None, normal Lower (legs, knees, ankles, toes): None, normal, Trunk Movements Neck, shoulders, hips: None, normal, Overall Severity Severity of abnormal movements (highest score from questions above): None, normal Incapacitation due to abnormal movements: None, normal Patient's awareness of abnormal movements (rate only patient's report): No Awareness, Dental Status Current problems with teeth and/or dentures?: No Does patient usually wear dentures?: No  CIWA:    COWS:     Musculoskeletal: Strength & Muscle Tone: within normal limits Gait & Station: normal Patient leans: N/A  Psychiatric Specialty Exam: Physical Exam  Nursing note and vitals reviewed. Review   Review of Systems  Psychiatric/Behavioral: Positive for memory loss. The patient is nervous/anxious.   All other systems reviewed and are negative.   Blood pressure (!) 148/67, pulse 81, temperature 98.6 F (37 C), temperature source Oral, resp. rate 20, height '4\' 9"'  (1.448 m), weight 67.1 kg (148 lb), SpO2 100 %.Body mass index is 32.03 kg/m.  General Appearance: Casual  Eye Contact:  Good  Speech:  Clear and Coherent  Volume:  Normal  Mood:  Anxious, Hopeless and Worthless  Affect:  Blunt  Thought Process:  Disorganized and Descriptions of Associations: Tangential  Orientation:  Full (Time, Place, and Person)  Thought Content:  Paranoid Ideation  Suicidal Thoughts:  No  Homicidal Thoughts:  No  Memory:  Immediate;   Fair Recent;   Fair Remote;   Fair  Judgement:  Impaired  Insight:  Lacking  Psychomotor Activity:  Decreased  Concentration:  Concentration: Poor and Attention Span: Poor  Recall:  Poor  Fund of Knowledge:  Fair  Language:  Fair  Akathisia:  No  Handed:  Right  AIMS (if indicated):     Assets:  Communication Skills Desire for Improvement Housing Physical Health Resilience Social  Support Vocational/Educational  ADL's:  Intact  Cognition:  WNL  Sleep:  Number of Hours: 7.3     Treatment Plan Summary: Daily contact with patient to assess and evaluate symptoms and progress in treatment and Medication management   Ms. Dewing is a 61 year old female with a history of depression admitted after presumed suicide attempt, that the patient denies, confusion and thought disorganization.   1. Suicidal ideation. The patient adamantly denies any thoughts, intentions, or plans to hurt herself or others.   2. Mood. We discontinued  Paxil and increased Luvox to 200 mg for depression and OCD. We continued Seroquel 100 mg at night and 25 mg tid.  3. Anxiety. Vistaril is available.    4. Insomnia. Improved with Seroquel.   5. HTN. She is on Maxzide.  6. Dyslipidemia. She is on Zocor.  7. Family conflict. Family contacted Social services.   8. Cognitive decline. Head CT scan shows mild small vessel disease. Neurocognitive testing shows mild to moderate cognitive decline. See Psychological consultation. RPR and HIV negative. OT and speech pathology input is highly appreciated.   9. UTI. She completed a course of Keflex.  10. B12 defficiancy. She is on B12 injections. Will start oral supplementation tomorrow.   11. Disposition. The patient will be discharged to home. She will follow up with her PCP. She needs psychiatry appointment.   Orson Slick, MD 10/27/2016, 3:12 PM

## 2016-10-27 NOTE — [facility] Suicide Risk Assessment (Signed)
[facility] Discharge Suicide Risk Assessment   Principal Problem: Severe recurrent major depression without psychotic features [facility]) Discharge Diagnoses:  Patient Active Problem List   Diagnosis Date Noted  . Mild vascular neurocognitive disorder [F01.50] 10/27/2016  . B12 deficiency [E53.8] 10/27/2016  . UTI (urinary tract infection) [N39.0] 10/17/2016  . OCD (obsessive compulsive disorder) [F42.9] 10/17/2016  . Severe recurrent major depression without psychotic features ([place]) [F33.2] 10/16/2016  . Hypertension [I10] 10/16/2016    Total Time spent with patient: 30 minutes  Musculoskeletal: Strength & Muscle Tone: within normal limits Gait & Station: normal Patient leans: N/A  Psychiatric Specialty Exam: Review of Systems  Psychiatric/Behavioral: Positive for memory loss. The patient is nervous/anxious.   All other systems reviewed and are negative.   Blood pressure (!) 148/67, pulse 81, temperature 98.6 F (37 C), temperature source Oral, resp. rate 20, height 4\' 9"  (1.448 m), weight 67.1 kg (148 lb), SpO2 100 %.Body mass index is 32.03 kg/m.  General Appearance: Casual  Eye Contact::  Good  Speech:  Clear and Coherent409  Volume:  Normal  Mood:  Anxious  Affect:  Appropriate  Thought Process:  Goal Directed and Descriptions of Associations: Intact  Orientation:  Full (Time, Place, and Person)  Thought Content:  WDL  Suicidal Thoughts:  No  Homicidal Thoughts:  No  Memory:  Immediate;   Fair Recent;   Fair Remote;   Fair  Judgement:  Impaired  Insight:  Shallow  Psychomotor Activity:  Normal  Concentration:  Fair  Recall:  [place]  Language: Fair  Akathisia:  No  Handed:  Right  AIMS (if indicated):     Assets:  Communication Skills Desire for Improvement Financial Resources/Insurance Housing Physical Health Resilience Social Support Transportation Vocational/Educational  Sleep:  Number of Hours: 7.3  Cognition: WNL  ADL's:  Intact    Mental Status Per Nursing Assessment::   On Admission:  NA  Demographic Factors:  Divorced or widowed, Caucasian and Low socioeconomic status  Loss Factors: Loss of significant relationship and Financial problems/change in socioeconomic status  Historical Factors: Family history of mental illness or substance abuse and Impulsivity  Risk Reduction Factors:   Responsible for children under 34 years of age, Sense of responsibility to family, Employed, Living with another person, especially a relative, Positive social support and Positive therapeutic relationship  Continued Clinical Symptoms:  Depression:   Impulsivity Obsessive-Compulsive Disorder  Cognitive Features That Contribute To Risk:  None    Suicide Risk:  Minimal: No identifiable suicidal ideation.  Patients presenting with no risk factors but with morbid ruminations; may be classified as minimal risk based on the severity of the depressive symptoms  Follow-up [facility]. Go on 10/29/2016.   Why:  Please go to [facility] on 10/29/16 at 230pm.  Please bring ID, insurance information, and hospital discharge paperwork.  Any questions, call Sherrian Divers at [phone]. Contact information: [place] [address] [phone]           Plan Of Care/Follow-up recommendations:  Activity:  As tolerated Diet:  Low sodium heart healthy. Other:  Keep follow-up appointments.  Orson Slick, MD 10/27/2016, 1:23 PM

## 2016-10-27 NOTE — [facility] Group Notes (Signed)
[place] Group Notes:  (Nursing/MHT/Case Management/Adjunct)  Date:  10/27/2016  Time:  5:21 PM  Type of Therapy:  Psychoeducational Skills  Participation Level:  Active  Participation Quality:  Appropriate, Attentive and Supportive  Affect:  Appropriate  Cognitive:  Appropriate  Insight:  Improving  Engagement in Group:  Engaged and Supportive  Modes of Intervention:  Discussion and Education  Summary of Progress/Problems:  Charise Killian 10/27/2016, 5:21 PM

## 2016-10-27 NOTE — [facility] Group Notes (Signed)
[facility] LCSW Group Therapy Note  Date/Time: 10/27/2016, 1:00pm   Type of Therapy and Topic:  Group Therapy:  Overcoming Obstacles  Participation Level:  Good   Description of Group:    In this group patients will be encouraged to explore what they see as obstacles to their own wellness and recovery. They will be guided to discuss their thoughts, feelings, and behaviors related to these obstacles. The group will process together ways to cope with barriers, with attention given to specific choices patients can make. Each patient will be challenged to identify changes they are motivated to make in order to overcome their obstacles. This group will be process-oriented, with patients participating in exploration of their own experiences as well as giving and receiving support and challenge from other group members.  Therapeutic Goals: 1. Patient will identify personal and current obstacles as they relate to admission. 2. Patient will identify barriers that currently interfere with their wellness or overcoming obstacles.  3. Patient will identify feelings, thought process and behaviors related to these barriers. 4. Patient will identify two changes they are willing to make to overcome these obstacles:    Summary of Patient Progress   Pt participated able to achieve above therapeutic goals.   Therapeutic Modalities:   Cognitive Behavioral Therapy Solution Focused Therapy Motivational Interviewing Relapse Prevention Therapy  Dossie Arbour, MSW, LCSW

## 2016-10-27 NOTE — Progress Notes (Signed)
Pt denies SI/HI/AVH. Pt fearful and anxious over disruptive behavior of peers. Isolated to room and fearful to come out. Encouragement and support provided. Pt reassured of safety. Medications given as ordered. Pt pleasant during interaction. Mild confusion noted. Will continue to monitor.

## 2016-10-27 NOTE — Progress Notes (Deleted)
Recreation Therapy Notes  Date: 01.15.18 Time: 9:30 am Location: Craft Room  Group Topic: Self-expression  Goal Area(s) Addresses:  Patient will identify one color per emotion listed on wheel. Patient will verbalize benefit of using art as a means of self-expression. Patient will verbalize one emotion experienced during group. Patient will be educated on other forms of self-expression.  Behavioral Response: Attentive, Interactive, Left early  Intervention: Emotion Wheel  Activity: Patients were given an Licensed conveyancer with 7 different emotions listed. Patients were instructed to pick a color for each emotion and fill it in on the worksheet.  Education: LRT educated patients on other forms of self-expression.  Education Outcome: In group clarification offered   Clinical Observations/Feedback: Patient picked colors for each emotion listed on the wheel. Patient contributed to group discussion by stating what color she picked and why. Patient was focused on GERD. Patient left group at approximately 9:55 am and did not return to group.  Leonette Monarch, LRT/CTRS 10/27/2016 10:06 AM

## 2016-10-27 NOTE — Progress Notes (Signed)
Recreation Therapy Notes  Date: 01.15.18 Time: 9:30 am Location: Craft Room  Group Topic: Self-expression  Goal Area(s) Addresses:  Patient will identify one color per emotion listed on wheel. Patient will verbalize benefit of using art as a means of self-expression. Patient will verbalize one emotion experienced during group. Patient will be educated on other forms of self-expression.  Behavioral Response: Attentive  Intervention: Emotion Wheel  Activity: Patients were given an Licensed conveyancer with 7 different emotions listed. Patients were instructed to pick a color for each emotion and fill it in on the worksheet.  Education: LRT educated patients on other forms of self-expression.  Education Outcome: In group clarification offered  Clinical Observations/Feedback: Patient picked a color for each emotion. Patient did not contribute to group discussion.  Leonette Monarch, LRT/CTRS 10/27/2016 10:08 AM
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
## 2016-10-28 NOTE — Plan of Care (Signed)
Problem: Activity: Goal: Sleeping patterns will improve Outcome: Progressing Patient slept for Estimated Hours of 7.45; safety maintained, no injury or falls during this shift.

## 2016-10-28 NOTE — [facility] Group Notes (Signed)
[place] Group Notes:  (Nursing/MHT/Case Management/Adjunct)  Date:  10/28/2016  Time:  9:33 PM  Type of Therapy:  Psychoeducational Skills  Participation Level:  Active  Participation Quality:  Appropriate  Affect:  Appropriate  Cognitive:  Alert  Insight:  Good  Engagement in Group:  Supportive  Modes of Intervention:  Support  Summary of Progress/Problems:  Kelsey Arroyo 10/28/2016, 9:33 PM

## 2016-10-28 NOTE — Progress Notes (Signed)
Patient ID: Kelsey Arroyo, female   DOB: [birth year], 61 y.o.   MRN: [id number] A&Ox3 but sounds like she has been rehearsing the questions; denied pain; visible in the day room interacting with others; anxious, fidgeting, worried about where she will be discharged to: "I have options, I have 2 daughters, I can go to either one of them and then, I can go to my mother but we don't get along ..." Medications compliant and knowledgeable about her medications.

## 2016-10-28 NOTE — Evaluation (Signed)
Occupational Therapy Evaluation Patient Details Name: Kelsey Arroyo MRN: [id number] DOB: [birth year] Today's Date: 10/28/2016    History of Present Illness 61yo female pt with hx of severe major depression admitted after presumed suicide attempt, which patient denies, confusion, and thought disorganization. PMH significant for dementia w/ behavioral disturbance, B12 deficiency, recent UTI, OCD, severe recurrent major depression without psychotic features, HTN, HLD, skin cancer.    Clinical Impression   Pt pleasant, agreeable to OT session. Pt appears anxious/mildly upset at times becoming somewhat tearful when talking about situation/family, but generally motivated to participate and converse with OT throughout. MOCA assessment indicates mild cognitive impairment. However, during functional life skills/home mgt tasks (financial mgt, medication mgt, safety awareness), pt was able to safely and appropriately respond to all situations and tasks demonstrating nice organizational and problem solving skills and safety awareness. OT feels and pt reports feeling safe to return to work after hospitalization. OT recommends considering vocational "coach" to support initially. OT feels pt would also benefit from additional mental health resources after hospitalization as appropriate per MD to help her manage her anxiety which seems to impact social/emotional relationships and interactions as well as quality of life/well-being. No further OT needs identified at this time. Will complete the order.    Follow Up Recommendations  No OT follow up    Equipment Recommendations  None recommended by OT    Recommendations for Other Services Other (comment): outpatient mental health resources as appropriate per MD to support pt mental health, vocational "coach" for work.     Precautions / Restrictions Precautions Precautions: None Restrictions Weight Bearing Restrictions: No      Mobility Bed  Mobility Overal bed mobility: Independent                Transfers Overall transfer level: Independent                    Balance Overall balance assessment: Independent                                          ADL Overall ADL's : Independent                                       General ADL Comments: Pt independent with basic self care ADL     Vision Vision Assessment?: No apparent visual deficits   Perception     Praxis      Pertinent Vitals/Pain Pain Assessment: No/denies pain     Hand Dominance Right   Extremity/Trunk Assessment Upper Extremity Assessment Upper Extremity Assessment: Overall WFL for tasks assessed   Lower Extremity Assessment Lower Extremity Assessment: Overall WFL for tasks assessed   Cervical / Trunk Assessment Cervical / Trunk Assessment: Normal   Communication Communication Communication: No difficulties   Cognition Arousal/Alertness: Awake/alert Behavior During Therapy: Anxious Overall Cognitive Status: No family/caregiver present to determine baseline cognitive functioning                 General Comments: chart indicates dementia diagnosis, Montreal Cognitive Assessment (MOCA) administered during eval, score: 21/30 (>/=26/30 is normal) indicating mild cognitive impairment. Pt unable to recall 5 random words after 5 minutes without cuing and had difficulty with visuospatial/executive functioning task by copying a cube, also slight difficulty with counting backwards by  7 from 100   General Comments       Exercises   Other Exercises Other Exercises: Pt participated in financial management activity and was able to independently and appropriately identify and problem solve various aspects of paying bills, managing banking statements, and making purchases at a grocery store. OT educated pt in strategies to support memory/recall/time mgt Other Exercises: Pt participated in medication  mgt activity and was able to safely and correctly identify all aspects of prescription labels. Pt reports her routine for medication mgt is to refills prescriptions by phone or in person and occasionally has daugther pick them up for convenience. Other Exercises: Pt participated in safety awareness/hazard identification/home safety activity and was able to appropriately identify and problem solve various safety and medical situations and verbalized plans for addressing each situation given to her Other Exercises: OT educated pt in options for emotional/social support to promote well-being and mental health and pt identified 1 friend she felt like she could call to "vent" to if/when needed. OT encouraged pt to consider also seeking out a licensed therapist to support her mental health/anxiety which appears to negatively impact her meaningful engagement in daily activities with others.   Shoulder Instructions      Home Living Family/patient expects to be discharged to:: Private residence Living Arrangements: Parent;Other relatives (older sister lives in basement, pt lives in back bedroom) Available Help at Discharge:  (somewhat unclear who/how much support she may have due to family conflicts) Type of Home: House Home Access: Stairs to enter [employer] of Steps: 4 STE, no difficulty with negotiating stairs   Home Layout: Able to live on main level with bedroom/bathroom;Two level (1 level with basement ) Alternate Level Stairs-Number of Steps: pt does not go downstairs, as it is where her older sister lives   Bathroom Shower/Tub: Teacher, early years/pre: Standard         Additional Comments: Appears family relationships are strained, pt reports feeling unwanted by mother and reports being anxious when thinking about interactions by phone or in person with her family      Prior Functioning/Environment Level of Independence: Independent        Comments: Pt independent  with ADL at baseline, drives short distances to familiar locations to pay bills, go to bank, work, groceries, Social research officer, government. Pt manages medication and finances independently, however sometimes asks her dtr to pick up meds from store for convenience        OT Problem List: Decreased cognition   OT Treatment/Interventions:      OT Goals(Current goals can be found in the care plan section)    OT Frequency:     Barriers to D/C:  Decreased family support at home, family conflict/relationships significantly impacting pt anxiety/well-being          Co-evaluation              End of Session    Activity Tolerance: Patient tolerated treatment well Patient left: Other (comment) Pt left in hall walking to medication room per nsg request at end of OT session.   Time: 1430-1602 OT Time Calculation (min): 92 min Charges:  OT General Charges $OT Visit: 1 Procedure OT Evaluation $OT Eval Moderate Complexity: 1 Procedure OT Treatments $Therapeutic Activity: 23-37 mins $Cognitive Skills Development: 38-52 mins G-Codes:    Corky Sox, MPH, MS, OTR/L 10/28/2016, 5:15 PM

## 2016-10-28 NOTE — Progress Notes (Addendum)
[facility] MD Progress Note  10/28/2016 5:33 PM Kelsey Arroyo  MRN:  [id number]  Subjective:   1//2018. Kelsey Arroyo met with treatment team today. She reports absolutely no improvement since admission. She still complains of feeling very anxious and confused. When asked what she is confused about she is not able to answer. This is a striking change from her previous level of functioning as she works as a Scientist, water quality at [employer]. She is fidgety and rather disorganized on the interview. She tolerates medications and accepts them well. Sleep and appetite are okay. She has not been in contact with her family. She is very worried about returning to her mother's house. The Kelsey Arroyo has severe OCD and her mother is tired of dictation organizing her household. She denies any symptoms of UTI and has been taking Keflex for it. Urine culture was contaminated. The Kelsey Arroyo goes to groups and has been able to participate productively. We will order a head CT scan and neurocognitive testing to clarify diagnosis.  10/22/2016. Kelsey Arroyo feels very anxious today again. First thing in the morning she was asking for anxiety medication. It is true that she has been on Xanax for many years. She discontinued Xanax 5 days prior to admission and has been in the hospital for 6 days already. She should not be having physical symptoms of withdrawal. We would not recommend benzodiazepines at this point as there are considerable worries about cognitive decline. She met with our psychologist today to complete neuropsych testing. As she was diagnosed with mild to moderate cognitive impairment. Yesterday we change her Risperdal to Zyprexa in hopes that some of her symptoms were due to Risperdal side effects. The Kelsey Arroyo does not report any somatic symptoms. She takes medications as prescribed but she is not getting better.  10/23/2016. Kelsey Arroyo is a patch better today. She is showered and very nicely dressed. For the first time we are able to have a  pretty decent conversation. She remembered that she gave urine son called this morning. He had urine culture was unsuccessful and she was given Nexium and she was given Keflex ex juvantibus to treat urinary tract infection. Her urine is clear now. The Kelsey Arroyo still reports foul smell of urine. She reports feeling shaky but is not as preoccupied with family conflict. She does not believe she could return to work or driving today. She accepts medications and tolerates them well.  10/24/2016. Kelsey Arroyo still feels overly confused and very anxious about returning home to her responsibilities. She complains of feeling shaky. She had difficulties finding her room today. Her family went to Social Services to inquire about their options in placing the Kelsey Arroyo. She accepts medications and tolerates them well I will switch Zyprexa to Seroquel tonight and even low-dose Seroquel for anxiety.  Follow-up for January 13. Kelsey Arroyo seen. Chart reviewed. Note from Dr. Mamie Nick reviewed. Kelsey Arroyo says that she subjectively feels confused but denies being depressed denies suicidal thoughts. Kelsey Arroyo is adequately groomed. She stays mostly to herself in her room looking at the newspaper. Not reporting any violent thoughts. No suicidal ideation.  Follow-up January 14. Kelsey Arroyo seen. Chart reviewed. Kelsey Arroyo continues to describe herself as "confused" although I found that she was alert and oriented 4 and actually able to describe her situation and history reasonably well. She says her mood is feeling better. I wonder if some of her "confusion" is now a way to avoid unpleasant family situation. Stays mostly to herself but affect is euthymic and her health appears  to be stable.  10/27/2916 Kelsey Arroyo became extremely anxious discussing discharge plan today. She feels "confused and shaky". I was mistaken believing that neuropsychological testing revealed mild to moderate cognitive problems. According to psychologist note it is "moderate to  severe". We will ask OT and speech therapy for additional evaluation to determine her level of functioning. This Kelsey Arroyo was at work and driving just hours prior to admission.  10/28/2016. We will plan to discharge Kelsey Arroyo today but I am so glad we didn't. The Kelsey Arroyo was evaluated by OT this afternoon. Overall impression is that her performance is highly influenced by high anxiety but that the Kelsey Arroyo can return to work safety. Occupational input is greatly appreciated. Anticipated discharge tomorrow.  Per nursing: A&Ox3 but sounds like she has been rehearsing the questions; denied pain; visible in the day room interacting with others; anxious, fidgeting, worried about where she will be discharged to: "I have options, I have 2 daughters, I can go to either one of them and then, I can go to my mother but we don't get along ..." Medications compliant and knowledgeable about her medications.  Principal Problem: Severe recurrent major depression without psychotic features ([place]) Diagnosis:   Kelsey Arroyo Active Problem List   Diagnosis Date Noted  . Dementia with behavioral disturbance [F03.91] 10/27/2016  . B12 deficiency [E53.8] 10/27/2016  . UTI (urinary tract infection) [N39.0] 10/17/2016  . OCD (obsessive compulsive disorder) [F42.9] 10/17/2016  . Severe recurrent major depression without psychotic features ([place]) [F33.2] 10/16/2016  . Hypertension [I10] 10/16/2016   Total Time spent with Kelsey Arroyo: 20 minutes  Past Psychiatric History: depression.  Past Medical History:  Past Medical History:  Diagnosis Date  . Depression   . Hyperlipemia   . Hypertension   . Skin cancer     Past Surgical History:  Procedure Laterality Date  . CESAREAN SECTION     x2   Family History: History reviewed. No pertinent family history. Family Psychiatric  History: See H&P. Social History:  History  Alcohol Use No     History  Drug Use No    Social History   Social History  . Marital status:  Single    Spouse name: N/A  . Number of children: N/A  . Years of education: N/A   Social History Main Topics  . Smoking status: Never Smoker  . Smokeless tobacco: Never Used  . Alcohol use No  . Drug use: No  . Sexual activity: Not Asked   Other Topics Concern  . None   Social History Narrative  . None   Additional Social History:  Specify valuables returned: items at bedside                      Sleep: Fair  Appetite:  Fair  Current Medications: Current Facility-Administered Medications  Medication Dose Route Frequency Provider Last Rate Last Dose  . acetaminophen (TYLENOL) tablet 650 mg  650 mg Oral Q6H PRN Gonzella Lex, MD   650 mg at 10/17/16 7902  . alum & mag hydroxide-simeth (MAALOX/MYLANTA) 200-200-20 MG/5ML suspension 30 mL  30 mL Oral Q4H PRN Gonzella Lex, MD      . cyanocobalamin ((VITAMIN B-12)) injection 1,000 mcg  1,000 mcg Intramuscular Once Lea Baine B Dalayna Lauter, MD      . fluvoxaMINE (LUVOX) tablet 200 mg  200 mg Oral QHS Illiana Losurdo B Celest Reitz, MD   200 mg at 10/27/16 2111  . hydrOXYzine (ATARAX/VISTARIL) tablet 50 mg  50 mg Oral  TID PRN Lenward Chancellor, MD   50 mg at 10/25/16 1454  . magnesium hydroxide (MILK OF MAGNESIA) suspension 30 mL  30 mL Oral Daily PRN Gonzella Lex, MD      . QUEtiapine (SEROQUEL) tablet 100 mg  100 mg Oral QHS Clovis Fredrickson, MD   100 mg at 10/27/16 2113  . QUEtiapine (SEROQUEL) tablet 25 mg  25 mg Oral TID Clovis Fredrickson, MD   25 mg at 10/28/16 1603  . simvastatin (ZOCOR) tablet 10 mg  10 mg Oral q1800 Gonzella Lex, MD   10 mg at 10/28/16 1603  . triamterene-hydrochlorothiazide (MAXZIDE-25) 37.5-25 MG per tablet 1 tablet  1 tablet Oral Daily Gonzella Lex, MD   1 tablet at 10/28/16 [phone]  . vitamin B-12 (CYANOCOBALAMIN) tablet 1,000 mcg  1,000 mcg Oral Daily Clovis Fredrickson, MD   1,000 mcg at 10/28/16 1165    Lab Results:  No results found for this or any previous visit (from the past 48  hour(s)).  Blood Alcohol level:  Lab Results  Component Value Date   ETH <5 79/12/8331    Metabolic Disorder Labs: Lab Results  Component Value Date   HGBA1C 6.0 (H) 10/17/2016   MPG 126 10/17/2016   No results found for: PROLACTIN Lab Results  Component Value Date   CHOL 166 10/17/2016   TRIG 117 10/17/2016   HDL 49 10/17/2016   CHOLHDL 3.4 10/17/2016   VLDL 23 10/17/2016   LDLCALC 94 10/17/2016    Physical Findings: AIMS: Facial and Oral Movements Muscles of Facial Expression: None, normal Lips and Perioral Area: None, normal Jaw: None, normal Tongue: None, normal,Extremity Movements Upper (arms, wrists, hands, fingers): None, normal Lower (legs, knees, ankles, toes): None, normal, Trunk Movements Neck, shoulders, hips: None, normal, Overall Severity Severity of abnormal movements (highest score from questions above): None, normal Incapacitation due to abnormal movements: None, normal Kelsey Arroyo's awareness of abnormal movements (rate only Kelsey Arroyo's report): No Awareness, Dental Status Current problems with teeth and/or dentures?: No Does Kelsey Arroyo usually wear dentures?: No  CIWA:    COWS:     Musculoskeletal: Strength & Muscle Tone: within normal limits Gait & Station: normal Kelsey Arroyo leans: N/A  Psychiatric Specialty Exam: Physical Exam  Nursing note and vitals reviewed. Review   Review of Systems  Psychiatric/Behavioral: Positive for memory loss. The Kelsey Arroyo is nervous/anxious.   All other systems reviewed and are negative.   Blood pressure 138/86, pulse 96, temperature 98.5 F (36.9 C), temperature source Oral, resp. rate 18, height '4\' 9"'  (1.448 m), weight 67.1 kg (148 lb), SpO2 100 %.Body mass index is 32.03 kg/m.  General Appearance: Casual  Eye Contact:  Good  Speech:  Clear and Coherent  Volume:  Normal  Mood:  Anxious, Hopeless and Worthless  Affect:  Blunt  Thought Process:  Disorganized and Descriptions of Associations: Tangential  Orientation:   Full (Time, Place, and Person)  Thought Content:  Paranoid Ideation  Suicidal Thoughts:  No  Homicidal Thoughts:  No  Memory:  Immediate;   Fair Recent;   Fair Remote;   Fair  Judgement:  Impaired  Insight:  Lacking  Psychomotor Activity:  Decreased  Concentration:  Concentration: Poor and Attention Span: Poor  Recall:  Poor  Fund of Knowledge:  Fair  Language:  Fair  Akathisia:  No  Handed:  Right  AIMS (if indicated):     Assets:  Communication Skills Desire for Improvement Housing Physical Health Resilience Social Support Vocational/Educational  ADL's:  Intact  Cognition:  WNL  Sleep:  Number of Hours: 7.45     Treatment Plan Summary: Daily contact with Kelsey Arroyo to assess and evaluate symptoms and progress in treatment and Medication management   Kelsey Arroyo is a 61 year old female with a history of depression admitted after presumed suicide attempt, that the Kelsey Arroyo denies, confusion and thought disorganization.   1. Suicidal ideation. The Kelsey Arroyo adamantly denies any thoughts, intentions, or plans to hurt herself or others.   2. Mood. We continue Luvox and Seroquel for depression and OCD.   3. Anxiety. Vistaril is available.    4. Insomnia. Improved with Seroquel.   5. HTN. She is on Maxzide.  6. Dyslipidemia. She is on Zocor.  7. Family conflict. Family contacted Social services.   8. Cognitive decline. Head CT scan shows mild small vessel disease. RPR and HIV negative. Psychological and OT consultations are greatly appreciated. They indicate mild cognitive impairment MOCA 21/30.    9. UTI. She completed a course of Keflex.  10. B12 defficiancy. She completed 7 day course of B12 injections. She is on oral supplementation.    11. Disposition. The Kelsey Arroyo will be discharged to home. She will follow up with her PCP. She needs psychiatry appointment and vocational coach.  I certify that the services received since the previous  certification/recertification were and continue to be medically necessary as the treatment provided can be reasonably expected to improve the Kelsey Arroyo's condition; the medical record documents that the services furnished were intensive treatment services or their equivalent services, and this Kelsey Arroyo continues to need, on a daily basis, active treatment furnished directly by or requiring the supervision of inpatient psychiatric personnel.   Orson Slick, MD 10/28/2016, 5:33 PM

## 2016-10-28 NOTE — Progress Notes (Signed)
Patient with appropriate affect, cooperative behavior with meals, meds and plan of care. No SI/HI at this time. Social with peers, appropriate with staff. Therapy encouraged. Safety maintained.

## 2016-10-28 NOTE — Progress Notes (Signed)
SLP Cancellation Note  Patient Details Name: Kelsey Arroyo MRN: [id number] DOB: 1956/04/16   Cancelled treatment:       Reason Eval/Treat Not Completed: SLP screened, no needs identified, will sign off (chart reviewed; discussed w/ OT, NSG ). Upon further review of pt's chart, dx of major depressive disorder w/ OCD(per MD note), and presentation, pt will be best assessed by the OT who can then address poc recommendations; consulted OT who agreed. NSG agreed. ST services will sign off at this time.   Orinda Kenner, [place], CCC-SLP Watson,Katherine 10/28/2016, 9:21 AM

## 2016-10-28 NOTE — Progress Notes (Signed)
OT Cancellation Note  Patient Details Name: HELOISA NIRENBERG MRN: [id number] DOB: 07-31-1956   Cancelled Treatment:    Reason Eval/Treat Not Completed: Patient at procedure or test/ unavailable. Order received, chart reviewed, consulted SLP. Attempted to eval at 1:20pm, pt attending group therapy. Will come back later today to evaluate for OT as available.  Corky Sox, OTR/L 10/28/2016, 2:22 PM

## 2016-10-28 NOTE — Progress Notes (Signed)
10/28/16 0840   [facility], Vocational Rehabilitation.  They do work with individuals the age of Kelsey Arroyo.  Since she is already employed, they can look at a job coach to assist or even something called "job reengineering" if she needs different responsibilities to be able to continue her employment.  Pt is eligible based on any diagnosis.  To initiate services, pt needs to call [phone] and schedule orientation. Lurline Idol, LCSW

## 2016-10-28 NOTE — Tx Team (Signed)
Interdisciplinary Treatment and Diagnostic Plan Update  10/28/2016  Time of Session: [place] MRN: [id number]  Principal Diagnosis: Severe recurrent major depression without psychotic features [facility])  Secondary Diagnoses: Principal Problem:   Severe recurrent major depression without psychotic features ([place]) Active Problems:   Hypertension   UTI (urinary tract infection)   OCD (obsessive compulsive disorder)   Dementia with behavioral disturbance   B12 deficiency   Current Medications:  Current Facility-Administered Medications  Medication Dose Route Frequency Provider Last Rate Last Dose  . acetaminophen (TYLENOL) tablet 650 mg  650 mg Oral Q6H PRN Gonzella Lex, MD   650 mg at 10/17/16 [id number]  . alum & mag hydroxide-simeth (MAALOX/MYLANTA) 200-200-20 MG/5ML suspension 30 mL  30 mL Oral Q4H PRN Gonzella Lex, MD      . cyanocobalamin ((VITAMIN B-12)) injection 1,000 mcg  1,000 mcg Intramuscular Once Jolanta B Pucilowska, MD      . fluvoxaMINE (LUVOX) tablet 200 mg  200 mg Oral QHS Jolanta B Pucilowska, MD   200 mg at 10/27/16 2111  . hydrOXYzine (ATARAX/VISTARIL) tablet 50 mg  50 mg Oral TID PRN Lenward Chancellor, MD   50 mg at 10/25/16 1454  . magnesium hydroxide (MILK OF MAGNESIA) suspension 30 mL  30 mL Oral Daily PRN Gonzella Lex, MD      . QUEtiapine (SEROQUEL) tablet 100 mg  100 mg Oral QHS Clovis Fredrickson, MD   100 mg at 10/27/16 2113  . QUEtiapine (SEROQUEL) tablet 25 mg  25 mg Oral TID Clovis Fredrickson, MD   25 mg at 10/28/16 0823  . simvastatin (ZOCOR) tablet 10 mg  10 mg Oral q1800 Gonzella Lex, MD   10 mg at 10/27/16 1624  . triamterene-hydrochlorothiazide (MAXZIDE-25) 37.5-25 MG per tablet 1 tablet  1 tablet Oral Daily Gonzella Lex, MD   1 tablet at 10/28/16 [phone]  . vitamin B-12 (CYANOCOBALAMIN) tablet 1,000 mcg  1,000 mcg Oral Daily Clovis Fredrickson, MD   1,000 mcg at 10/28/16 [id number]   PTA Medications: Prescriptions Prior to Admission   Medication Sig Dispense Refill Last Dose  . buPROPion (WELLBUTRIN XL) 300 MG 24 hr tablet Take 300 mg by mouth daily.     Marland Kitchen LORazepam (ATIVAN) 1 MG tablet Take 1 mg by mouth every 4 (four) hours as needed for anxiety.     Marland Kitchen PARoxetine (PAXIL) 20 MG tablet Take 20 mg by mouth daily.     . simvastatin (ZOCOR) 10 MG tablet Take 10 mg by mouth daily.     . potassium chloride (K-DUR) 10 MEQ tablet Take 4 tablets (40 mEq total) by mouth daily. 4 tablet 0     Patient Stressors: Health problems Medication change or noncompliance  Patient Strengths: Active sense of humor Average or above average intelligence Capable of independent living Communication skills Motivation for treatment/growth Work skills  Treatment Modalities: Medication Management, Group therapy, Case management,  1 to 1 session with clinician, Psychoeducation, Recreational therapy.   Physician Treatment Plan for Primary Diagnosis: Severe recurrent major depression without psychotic features ([place]) Long Term Goal(s): Improvement in symptoms so as ready for discharge NA   Short Term Goals: Ability to identify changes in lifestyle to reduce recurrence of condition will improve Ability to verbalize feelings will improve Ability to disclose and discuss suicidal ideas Ability to demonstrate self-control will improve Ability to identify and develop effective coping behaviors will improve Ability to maintain clinical measurements within normal limits will improve Compliance with  prescribed medications will improve Ability to identify triggers associated with substance abuse/mental health issues will improve NA  Medication Management: Evaluate patient's response, side effects, and tolerance of medication regimen.  Therapeutic Interventions: 1 to 1 sessions, Unit Group sessions and Medication administration.  Evaluation of Outcomes: Adequate for Discharge  Physician Treatment Plan for Secondary Diagnosis: Principal Problem:    Severe recurrent major depression without psychotic features ([place]) Active Problems:   Hypertension   UTI (urinary tract infection)   OCD (obsessive compulsive disorder)   Dementia with behavioral disturbance   B12 deficiency  Long Term Goal(s): Improvement in symptoms so as ready for discharge NA   Short Term Goals: Ability to identify changes in lifestyle to reduce recurrence of condition will improve Ability to verbalize feelings will improve Ability to disclose and discuss suicidal ideas Ability to demonstrate self-control will improve Ability to identify and develop effective coping behaviors will improve Ability to maintain clinical measurements within normal limits will improve Compliance with prescribed medications will improve Ability to identify triggers associated with substance abuse/mental health issues will improve NA     Medication Management: Evaluate patient's response, side effects, and tolerance of medication regimen.  Therapeutic Interventions: 1 to 1 sessions, Unit Group sessions and Medication administration.  Evaluation of Outcomes: Adequate for Discharge   RN Treatment Plan for Primary Diagnosis: Severe recurrent major depression without psychotic features ([place]) Long Term Goal(s): Knowledge of disease and therapeutic regimen to maintain health will improve  Short Term Goals: Ability to identify and develop effective coping behaviors will improve and Compliance with prescribed medications will improve  Medication Management: RN will administer medications as ordered by provider, will assess and evaluate patient's response and provide education to patient for prescribed medication. RN will report any adverse and/or side effects to prescribing provider.  Therapeutic Interventions: 1 on 1 counseling sessions, Psychoeducation, Medication administration, Evaluate responses to treatment, Monitor vital signs and CBGs as ordered, Perform/monitor CIWA, COWS, AIMS and  Fall Risk screenings as ordered, Perform wound care treatments as ordered.  Evaluation of Outcomes: Adequate for Discharge   LCSW Treatment Plan for Primary Diagnosis: Severe recurrent major depression without psychotic features ([place]) Long Term Goal(s): Safe transition to appropriate next level of care at discharge, Engage patient in therapeutic group addressing interpersonal concerns.  Short Term Goals: Engage patient in aftercare planning with referrals and resources  Therapeutic Interventions: Assess for all discharge needs, 1 to 1 time with Social worker, Explore available resources and support systems, Assess for adequacy in community support network, Educate family and significant other(s) on suicide prevention, Complete Psychosocial Assessment, Interpersonal group therapy.  Evaluation of Outcomes: Adequate for Discharge   Progress in Treatment: Attending groups: Yes. Participating in groups: Yes. Taking medication as prescribed: Yes. Toleration medication: Yes. Family/Significant other contact made: Yes, individual(s) contacted:  daughter Patient understands diagnosis: Yes. Discussing patient identified problems/goals with staff: Yes. Medical problems stabilized or resolved: Yes. Denies suicidal/homicidal ideation: Yes. Issues/concerns per patient self-inventory: Yes. Other: none  New problem(s) identified: No, Describe:  none  New Short Term/Long Term Goal(s):  Discharge Plan or Barriers:   Reason for Continuation of Hospitalization: Other; describe Ready for discharge  Estimated Length of Stay:  Attendees: Patient:Kelsey Arroyo 10/28/2016   Physician: Dr Bary Leriche, MD 10/28/2016   Nursing: Carolynn Sayers, RN 10/28/2016   RN Care Manager: 10/28/2016   Social Worker: Lurline Idol, LCSW 10/28/2016   Recreational Therapist:  10/28/2016   Other:  10/28/2016   Other:  10/28/2016   Other: 10/28/2016  Scribe for Treatment Team: Deirdre Evener 10/28/2016 11:42 AM

## 2016-10-29 LAB — URINALYSIS, COMPLETE (UACMP) WITH MICROSCOPIC
BILIRUBIN URINE: NEGATIVE
GLUCOSE, UA: NEGATIVE mg/dL
KETONES UR: NEGATIVE mg/dL
NITRITE: NEGATIVE
PH: 6 (ref 5.0–8.0)
Protein, ur: NEGATIVE mg/dL
SPECIFIC GRAVITY, URINE: 1.008 (ref 1.005–1.030)

## 2016-10-29 MED ORDER — NITROFURANTOIN MONOHYD MACRO 100 MG PO CAPS
100.0000 mg | ORAL_CAPSULE | Freq: Two times a day (BID) | ORAL | Status: DC
  Administered 2016-10-29 – 2016-10-30 (×2): 100 mg via ORAL
  Filled 2016-10-29 (×3): qty 1

## 2016-10-29 NOTE — Progress Notes (Signed)
Patient ID: Kelsey Arroyo, female   DOB: Mar 09, 1956, 61 y.o.   MRN: [id number]  A&Ox3, denied pain, denied SI/HI, pleasant, polite, appropriate, tolerating medications well, no S&S of SEs.

## 2016-10-29 NOTE — Progress Notes (Signed)
Pt informed of order for urine specimen. Specimen cup and wipes provided. Pt verbalizes understanding.

## 2016-10-29 NOTE — Progress Notes (Signed)
Pt awake, alert, oriented today without complaints. Complains that "I think my UTI is returning because my urine smells." Pt stays in her room except during meal/med times. Denies SI/HI/AVH. Appears anxious, worried. Pt is medication complaint. Attended groups.   Support and encouragement provided with use of therapeutic communication. Medications administered as ordered with education. Safety maintained with every 15 minute checks. Will continue to monitor.

## 2016-10-29 NOTE — Progress Notes (Signed)
Recreation Therapy Notes  Date: 01.17.18 Time: 9:30 am Location: Craft Room  Group Topic: Self-esteem  Goal Area(s) Addresses:  Patient will write at least one positive trait about self. Patient will verbalize benefit of having a healthy self-esteem.  Behavioral Response: Attentive  Intervention: I Am  Activity: Patients were given a worksheet with the letter I on it and were instructed to write as many positive traits about themselves inside the letter.  Education: LRT educated patients on ways they can increase their self-esteem.  Education Outcome: Acknowledges education/In group clarification offered  Clinical Observations/Feedback: Patient did not participate in group activity. Patient did not contribute to group discussion.  Leonette Monarch, LRT/CTRS 10/29/2016 10:26 AM

## 2016-10-29 NOTE — Plan of Care (Signed)
Problem: Activity: Goal: Sleeping patterns will improve Outcome: Progressing Patient slept for Estimated Hours of 7.30; 15 minutes safety round maintained, no injury or falls during this shift.

## 2016-10-29 NOTE — Plan of Care (Signed)
Problem: Self-Concept: Goal: Ability to identify factors that promote anxiety will improve Outcome: Progressing Pt continues to appear anxious. Attends groups and is able to verbalize coping skills.

## 2016-10-29 NOTE — Progress Notes (Addendum)
[facility] MD Progress Note  10/29/2016 5:56 PM DAILAH OPPERMAN  MRN:  [id number]  Subjective:   1//2018. Ms. Luster met with treatment team today. She reports absolutely no improvement since admission. She still complains of feeling very anxious and confused. When asked what she is confused about she is not able to answer. This is a striking change from her previous level of functioning as she works as a Scientist, water quality at [employer]. She is fidgety and rather disorganized on the interview. She tolerates medications and accepts them well. Sleep and appetite are okay. She has not been in contact with her family. She is very worried about returning to her mother's house. The patient has severe OCD and her mother is tired of dictation organizing her household. She denies any symptoms of UTI and has been taking Keflex for it. Urine culture was contaminated. The patient goes to groups and has been able to participate productively. We will order a head CT scan and neurocognitive testing to clarify diagnosis.  10/22/2016. Ms. Duling feels very anxious today again. First thing in the morning she was asking for anxiety medication. It is true that she has been on Xanax for many years. She discontinued Xanax 63 days prior to admission and has been in the hospital for 6 days already. She should not be having physical symptoms of withdrawal. We would not recommend benzodiazepines at this point as there are considerable worries about cognitive decline. She met with our psychologist today to complete neuropsych testing. As she was diagnosed with mild 63 to moderate cognitive impairment. Yesterday we change her Risperdal to Zyprexa in hopes that some of her symptoms were due to Risperdal side effects. The patient does not report any somatic symptoms. She takes medications as prescribed but she is not getting better.  10/23/2016. Ms. Dowding is a patch better today. She is showered and very nicely 61 dressed. For the first time we are able to have a  pretty decent conversation. She remembered that she gave urine son called this morning. He had urine culture was unsuccessful and she was given Nexium and she was given Keflex ex juvantibus to treat urinary tract infection. Her urine is clear now. The patient still reports foul smell of urine. She reports feeling shaky but is not as preoccupied with family conflict. She does not believe she could return to work or driving today. She accepts medications and tolerates them well.  10/24/2016. Ms. Brazeau still feels overly confused and very anxious about returning home to her responsibilities. She complains of feeling shaky. She had difficulties finding her room today. Her family went to Social Services to inquire about their options in placing the patient. She accepts medications and tolerates them well I will switch Zyprexa to Seroquel tonight and even low-dose Seroquel for anxiety.  Follow-up for January 13. Patient seen. Chart reviewed. Note from Dr. Mamie Nick reviewed. Patient says that she subjectively feels confused but denies being depressed denies suicidal thoughts. Patient is adequately groomed. She stays mostly to herself in her room looking at the newspaper. Not reporting any violent thoughts. No suicidal ideation.  Follow-up January 14. Patient 61 seen. Chart reviewed. Patient continues to describe herself 61 as "confused" although I found that she was alert and oriented 4 and actually able to describe her situation and history reasonably well. She says her mood is feeling better. I wonder if some of her "confusion" is now a way to avoid unpleasant family situation. Stays mostly to herself but affect is euthymic and her health appears  to be stable.  10/27/2916 Ms. Stemmler became extremely anxious discussing discharge plan today. She feels "confused and shaky". I was mistaken believing that neuropsychological testing revealed mild to moderate cognitive problems. According to psychologist note it is "moderate to  severe". We will ask OT and speech therapy for additional evaluation to determine her level of functioning. This patient was at work and driving just hours prior to admission.  10/28/2016. We will plan to 61 discharge Ms. Shiplett today but I am so glad we didn't. The patient was evaluated by OT this afternoon. Overall impression is that her performance is highly influenced by high anxiety but that the patient can return to work safety. Occupational input is greatly appreciated. Anticipated discharge tomorrow.  10/29/2016. Ms. Heeg seems more relaxed, less anxious and happier today. She did well in group. She feels 61 "shaky" only "sometimes". She did very well in group. She is very happy with OT assessment. She complains of symptoms of UTI and foul smelling urine. Sh completed a course of Keflex. We ordered UA and UCx.   Per nursing: A&Ox3, denied pain, denied SI/HI, pleasant, polite, appropriate, tolerating medications well, no S&S of SEs.  Principal Problem: Severe recurrent major depression without psychotic features ([place]) Diagnosis:   Patient Active Problem List   Diagnosis Date Noted  . Dementia with behavioral disturbance [F03.91] 10/27/2016  . B12 deficiency [E53.8] 10/27/2016  . UTI (urinary tract infection) [N39.0] 10/17/2016  . OCD (obsessive compulsive disorder) [F42.9] 10/17/2016  . Severe recurrent major depression without psychotic features ([place]) [F33.2] 10/16/2016  . Hypertension [I10] 10/16/2016   Total Time spent with patient: 20 minutes  Past Psychiatric History: depression.  Past Medical History:  Past Medical History:  Diagnosis Date  . Depression   . Hyperlipemia   . Hypertension   . Skin cancer     Past Surgical History:  Procedure Laterality Date  . CESAREAN SECTION     x2   Family History: History reviewed. No pertinent family history. Family Psychiatric  History: See H&P. Social History:  History  Alcohol Use No     History  Drug Use No    Social  History   Social History  . Marital status: Single    Spouse name: N/A  . Number of children: N/A  . Years of education: N/A   Social History Main Topics  . Smoking status: Never Smoker  . Smokeless tobacco: Never Used  . Alcohol use No  . Drug use: No  . Sexual activity: Not Asked   Other Topics Concern  . None   Social History Narrative  . None   Additional Social History:  Specify valuables returned: items at bedside                      Sleep: Fair  Appetite:  Fair  Current Medications: Current Facility-Administered Medications  Medication Dose Route Frequency Provider Last Rate Last Dose  . acetaminophen (TYLENOL) tablet 650 mg  650 mg Oral Q6H PRN Gonzella Lex, MD   650 mg at 10/17/16 5329  . alum & mag hydroxide-simeth (MAALOX/MYLANTA) 200-200-20 MG/5ML suspension 30 mL  30 mL Oral Q4H PRN Gonzella Lex, MD      . cyanocobalamin ((VITAMIN B-12)) injection 1,000 mcg  1,000 mcg Intramuscular Once Ahyan Kreeger B Ashwini Jago, MD      . fluvoxaMINE (LUVOX) tablet 200 mg  200 mg Oral QHS Gavinn Collard B Keath Matera, MD   200 mg at 10/28/16 2127  . hydrOXYzine (ATARAX/VISTARIL) tablet  50 mg  50 mg Oral TID PRN Lenward Chancellor, MD   50 mg at 10/25/16 1454  . magnesium hydroxide (MILK OF MAGNESIA) suspension 30 mL  30 mL Oral Daily PRN Gonzella Lex, MD      . QUEtiapine (SEROQUEL) tablet 100 mg  100 mg Oral QHS Clovis Fredrickson, MD   100 mg at 10/28/16 2127  . QUEtiapine (SEROQUEL) tablet 25 mg  25 mg Oral TID Clovis Fredrickson, MD   25 mg at 10/29/16 1645  . simvastatin (ZOCOR) tablet 10 mg  10 mg Oral q1800 Gonzella Lex, MD   10 mg at 10/29/16 1645  . triamterene-hydrochlorothiazide (MAXZIDE-25) 37.5-25 MG per tablet 1 tablet  1 tablet Oral Daily Gonzella Lex, MD   1 tablet at 10/29/16 [phone]  . vitamin B-12 (CYANOCOBALAMIN) tablet 1,000 mcg  1,000 mcg Oral Daily Clovis Fredrickson, MD   1,000 mcg at 10/29/16 9326    Lab Results:  Results for orders placed  or performed during the hospital encounter of 10/16/16 (from the past 48 hour(s))  Urinalysis, Complete w Microscopic     Status: Abnormal   Collection Time: 10/29/16  5:29 PM  Result Value Ref Range   Color, Urine YELLOW (A) YELLOW   APPearance HAZY (A) CLEAR   Specific Gravity, Urine 1.008 1.005 - 1.030   pH 6.0 5.0 - 8.0   Glucose, UA NEGATIVE NEGATIVE mg/dL   Hgb urine dipstick MODERATE (A) NEGATIVE   Bilirubin Urine NEGATIVE NEGATIVE   Ketones, ur NEGATIVE NEGATIVE mg/dL   Protein, ur NEGATIVE NEGATIVE mg/dL   Nitrite NEGATIVE NEGATIVE   Leukocytes, UA SMALL (A) NEGATIVE   RBC / HPF 0-5 0 - 5 RBC/hpf   WBC, UA 0-5 0 - 5 WBC/hpf   Bacteria, UA RARE (A) NONE SEEN   Squamous Epithelial / LPF 0-5 (A) NONE SEEN    Blood Alcohol level:  Lab Results  Component Value Date   ETH <5 71/24/5809    Metabolic Disorder Labs: Lab Results  Component Value Date   HGBA1C 6.0 (H) 10/17/2016   MPG 126 10/17/2016   No results found for: PROLACTIN Lab Results  Component Value Date   CHOL 166 10/17/2016   TRIG 117 10/17/2016   HDL 49 10/17/2016   CHOLHDL 3.4 10/17/2016   VLDL 23 10/17/2016   LDLCALC 94 10/17/2016    Physical Findings: AIMS: Facial and Oral Movements Muscles of Facial Expression: None, normal Lips and Perioral Area: None, normal Jaw: None, normal Tongue: None, normal,Extremity Movements Upper (arms, wrists, hands, fingers): None, normal Lower (legs, knees, ankles, toes): None, normal, Trunk Movements Neck, shoulders, hips: None, normal, Overall Severity Severity of abnormal movements (highest score from questions above): None, normal Incapacitation due to abnormal movements: None, normal Patient's awareness of abnormal movements (rate only patient's report): No Awareness, Dental Status Current problems with teeth and/or dentures?: No Does patient usually wear dentures?: No  CIWA:    COWS:     Musculoskeletal: Strength & Muscle Tone: within normal  limits Gait & Station: normal Patient leans: N/A  Psychiatric Specialty Exam: Physical Exam  Nursing note and vitals reviewed. Review   Review of Systems  Psychiatric/Behavioral: Positive for memory loss. The patient is nervous/anxious.   All other systems reviewed and are negative.   Blood pressure 118/71, pulse 78, temperature 97.2 F (36.2 C), temperature source Oral, resp. rate 18, height '4\' 9"'  (1.448 m), weight 67.1 kg (148 lb), SpO2 98 %.Body mass index is 32.03  kg/m.  General Appearance: Casual  Eye Contact:  Good  Speech:  Clear and Coherent  Volume:  Normal  Mood:  Anxious, Hopeless and Worthless  Affect:  Blunt  Thought Process:  Disorganized and Descriptions of Associations: Tangential  Orientation:  Full (Time, Place, and Person)  Thought Content:  Paranoid Ideation  Suicidal Thoughts:  No  Homicidal Thoughts:  No  Memory:  Immediate;   Fair Recent;   Fair Remote;   Fair  Judgement:  Impaired  Insight:  Lacking  Psychomotor Activity:  Decreased  Concentration:  Concentration: Poor and Attention Span: Poor  Recall:  Poor  Fund of Knowledge:  Fair  Language:  Fair  Akathisia:  No  Handed:  Right  AIMS (if indicated):     Assets:  Communication Skills Desire for Improvement Housing Physical Health Resilience Social Support Vocational/Educational  ADL's:  Intact  Cognition:  WNL  Sleep:  Number of Hours: 7.3     Treatment Plan Summary: Daily contact with patient to assess and evaluate symptoms and progress in treatment and Medication management   Ms. Heimsoth is a 61 year old female with a history of depression admitted after presumed suicide attempt, that the patient denies, confusion and thought disorganization.   1. Suicidal ideation. The patient adamantly denies any thoughts, intentions, or plans to hurt herself or others.   2. Mood. We continue Luvox and Seroquel for depression and OCD.   3. Anxiety. Vistaril is available.    4. Insomnia.  Improved with Seroquel.   5. HTN. She is on Maxzide.  6. Dyslipidemia. She is on Zocor.  7. Family conflict. Family contacted Social services.   8. Cognitive decline. Head CT scan shows mild small vessel disease. RPR and HIV negative. Psychological and OT consultations are greatly appreciated. They indicate mild cognitive impairment MOCA 21/30.    9. UTI. She completed a course of Keflex. We will check UA and UCx.  10. B12 defficiancy. She completed 7 day course of B12 injections. She is on oral supplementation.    11. Disposition. The patient will be discharged to home. She will follow up with her PCP. She needs psychiatry appointment and vocational coach.  Orson Slick, MD 10/29/2016, 5:56 PM

## 2016-10-29 NOTE — [facility] Group Notes (Signed)
[place] [facility] Group Notes:  (Nursing/MHT/Case Management/Adjunct)  Date:  10/29/2016  Time:  4:35 PM  Type of Therapy:  Psychoeducational Skills  Participation Level:  Did Not Attend  Charise Killian 10/29/2016, 4:35 PM

## 2016-10-30 DIAGNOSIS — G3184 Mild cognitive impairment, so stated: Secondary | ICD-10-CM | POA: Diagnosis present

## 2016-10-30 MED ORDER — TRIAMTERENE-HCTZ 37.5-25 MG PO TABS: 1.0000 | ORAL_TABLET | Freq: Every day | ORAL | 1 refills | Status: DC

## 2016-10-30 MED ORDER — NITROFURANTOIN MONOHYD MACRO 100 MG PO CAPS: 100.0000 mg | ORAL_CAPSULE | Freq: Two times a day (BID) | ORAL | 0 refills | Status: DC

## 2016-10-30 MED ORDER — FLUVOXAMINE MALEATE 100 MG PO TABS: 200.0000 mg | ORAL_TABLET | Freq: Every day | ORAL | 1 refills | Status: DC

## 2016-10-30 MED ORDER — HYDROXYZINE HCL 50 MG PO TABS: 50.0000 mg | ORAL_TABLET | Freq: Three times a day (TID) | ORAL | 1 refills | Status: DC | PRN

## 2016-10-30 MED ORDER — CYANOCOBALAMIN 1000 MCG PO TABS: 1000.0000 ug | ORAL_TABLET | Freq: Every day | ORAL | 1 refills | Status: DC

## 2016-10-30 MED ORDER — QUETIAPINE FUMARATE 100 MG PO TABS: 100.0000 mg | ORAL_TABLET | Freq: Every day | ORAL | 1 refills | Status: DC

## 2016-10-30 MED ORDER — QUETIAPINE FUMARATE 25 MG PO TABS: 25.0000 mg | ORAL_TABLET | Freq: Three times a day (TID) | ORAL | 1 refills | Status: DC

## 2016-10-30 NOTE — Progress Notes (Signed)
[facility] MD Progress Note  10/30/2016 9:24 AM Kelsey Arroyo  MRN:  [id number]  Subjective:   1//2018. Kelsey Arroyo met with treatment team today. She reports absolutely no improvement since admission. She still complains of feeling very anxious and confused. When asked what she is confused about she is not able to answer. This is a striking change from her previous level of functioning as she works as a Scientist, water quality at [employer]. She is fidgety and rather disorganized on the interview. She tolerates medications and accepts them well. Sleep and appetite are okay. She has not been in contact with her family. She is very worried about returning to her mother's house. The Kelsey has severe OCD and her mother is tired of dictation organizing her household. She denies any symptoms of UTI and has been taking Keflex for it. Urine culture was contaminated. The Kelsey goes to groups and has been able to participate productively. We will order a head CT scan and neurocognitive testing to clarify diagnosis.  10/22/2016. Kelsey Arroyo feels very anxious today again. First thing in the morning she was asking for anxiety medication. It is true that she has been on Xanax for many years. She discontinued Xanax 5 days prior to admission and has been in the hospital for 6 days already. She should not be having physical symptoms of withdrawal. We would not recommend benzodiazepines at this point as there are considerable worries about cognitive decline. She met with our psychologist today to complete neuropsych testing. As she was diagnosed with mild to moderate cognitive impairment. Yesterday we change her Risperdal to Zyprexa in hopes that some of her symptoms were due to Risperdal side effects. The Kelsey does not report any somatic symptoms. She takes medications as prescribed but she is not getting better.  10/23/2016. Kelsey Arroyo is a patch better today. She is showered and very nicely dressed. For the first time we are able to have a  pretty decent conversation. She remembered that she gave urine son called this morning. He had urine culture was unsuccessful and she was given Nexium and she was given Keflex ex juvantibus to treat urinary tract infection. Her urine is clear now. The Kelsey still reports foul smell of urine. She reports feeling shaky but is not as preoccupied with family conflict. She does not believe she could return to work or driving today. She accepts medications and tolerates them well.  10/24/2016. Kelsey Arroyo still feels overly confused and very anxious about returning home to her responsibilities. She complains of feeling shaky. She had difficulties finding her room today. Her family went to Social Services to inquire about their options in placing the Kelsey. She accepts medications and tolerates them well I will switch Zyprexa to Seroquel tonight and even low-dose Seroquel for anxiety.  Follow-up for January 13. Kelsey seen. Chart reviewed. Note from Dr. Mamie Nick reviewed. Kelsey Arroyo that she subjectively feels confused but denies being depressed denies suicidal thoughts. Kelsey is adequately groomed. She stays mostly to herself in her room looking at the newspaper. Not reporting any violent thoughts. No suicidal ideation.  Follow-up January 14. Kelsey seen. Chart reviewed. Kelsey continues to describe herself as "confused" although I found that she was alert and oriented 4 and actually able to describe her situation and history reasonably well. She Arroyo her mood is feeling better. I wonder if some of her "confusion" is now a way to avoid unpleasant family situation. Stays mostly to herself but affect is euthymic and her health appears  to be stable.  10/27/2916 Kelsey Arroyo became extremely anxious discussing discharge plan today. She feels "confused and shaky". I was mistaken believing that neuropsychological testing revealed mild to moderate cognitive problems. According to psychologist note it is "moderate to  severe". We will ask OT and speech therapy for additional evaluation to determine her level of functioning. This Kelsey was at work and driving just hours prior to admission.  10/28/2016. We will plan to discharge Kelsey Arroyo today but I am so glad we didn't. The Kelsey was evaluated by OT this afternoon. Overall impression is that her performance is highly influenced by high anxiety but that the Kelsey can return to work safety. Occupational input is greatly appreciated. Anticipated discharge tomorrow.  10/29/2016. Kelsey Arroyo seems more relaxed, less anxious and happier today. She did well in group. She feels "shaky" only "sometimes". She did very well in group. She is very happy with OT assessment. She complains of symptoms of UTI and foul smelling urine. Sh completed a course of Keflex. We ordered UA and UCx.   10/30/2016. Kelsey Arroyo is very anxious again today. She worries about her medications. Reportedly she just received a refill on her old prescriptions and medications are still in the car. She was explained several times about her medications which are currently Luvox and Seroquel. She worries about cost of medication. Yesterday she reported some dysphoria. Urinalysis does not indicate new Infection except for a trace of leukocyte esterase. We are awaiting results of culture. We started Macrobid. We are snowed in. Anticipated discharge tomorrow.  Per nursing: Quiet, minimal interaction with peers, pleasant on approach in the day area "today is my birthday you know, 27-Apr-1956 ..." A&Ox3, denied pain, happy to restart antibiotics for UTI, denied SI/HI, denied AV/H. Nursing staffs will continue to provide clinical and moral support.  Principal Problem: Severe recurrent major depression without psychotic features ([place]) Diagnosis:   Kelsey Active Problem List   Diagnosis Date Noted  . Dementia with behavioral disturbance [F03.91] 10/27/2016  . B12 deficiency [E53.8] 10/27/2016  . UTI (urinary  tract infection) [N39.0] 10/17/2016  . OCD (obsessive compulsive disorder) [F42.9] 10/17/2016  . Severe recurrent major depression without psychotic features ([place]) [F33.2] 10/16/2016  . Hypertension [I10] 10/16/2016   Total Time spent with Kelsey: 20 minutes  Past Psychiatric History: depression.  Past Medical History:  Past Medical History:  Diagnosis Date  . Depression   . Hyperlipemia   . Hypertension   . Skin cancer     Past Surgical History:  Procedure Laterality Date  . CESAREAN SECTION     x2   Family History: History reviewed. No pertinent family history. Family Psychiatric  History: See H&P. Social History:  History  Alcohol Use No     History  Drug Use No    Social History   Social History  . Marital status: Single    Spouse name: N/A  . Number of children: N/A  . Years of education: N/A   Social History Main Topics  . Smoking status: Never Smoker  . Smokeless tobacco: Never Used  . Alcohol use No  . Drug use: No  . Sexual activity: Not Asked   Other Topics Concern  . None   Social History Narrative  . None   Additional Social History:  Specify valuables returned: items at bedside                      Sleep: Fair  Appetite:  Fair  Current Medications:  Current Facility-Administered Medications  Medication Dose Route Frequency Provider Last Rate Last Dose  . acetaminophen (TYLENOL) tablet 650 mg  650 mg Oral Q6H PRN Gonzella Lex, MD   650 mg at 10/17/16 4081  . alum & mag hydroxide-simeth (MAALOX/MYLANTA) 200-200-20 MG/5ML suspension 30 mL  30 mL Oral Q4H PRN Gonzella Lex, MD      . cyanocobalamin ((VITAMIN B-12)) injection 1,000 mcg  1,000 mcg Intramuscular Once Yevette Knust B Lakai Moree, MD      . fluvoxaMINE (LUVOX) tablet 200 mg  200 mg Oral QHS Mat Stuard B Rube Sanchez, MD   200 mg at 10/29/16 2123  . hydrOXYzine (ATARAX/VISTARIL) tablet 50 mg  50 mg Oral TID PRN Lenward Chancellor, MD   50 mg at 10/25/16 1454  . magnesium hydroxide  (MILK OF MAGNESIA) suspension 30 mL  30 mL Oral Daily PRN Gonzella Lex, MD      . nitrofurantoin (macrocrystal-monohydrate) (MACROBID) capsule 100 mg  100 mg Oral Q12H Zeyad Delaguila B Manjinder Breau, MD   100 mg at 10/30/16 0848  . QUEtiapine (SEROQUEL) tablet 100 mg  100 mg Oral QHS Clovis Fredrickson, MD   100 mg at 10/29/16 2123  . QUEtiapine (SEROQUEL) tablet 25 mg  25 mg Oral TID Clovis Fredrickson, MD   25 mg at 10/30/16 0848  . simvastatin (ZOCOR) tablet 10 mg  10 mg Oral q1800 Gonzella Lex, MD   10 mg at 10/29/16 1645  . triamterene-hydrochlorothiazide (MAXZIDE-25) 37.5-25 MG per tablet 1 tablet  1 tablet Oral Daily Gonzella Lex, MD   1 tablet at 10/30/16 0847  . vitamin B-12 (CYANOCOBALAMIN) tablet 1,000 mcg  1,000 mcg Oral Daily Clovis Fredrickson, MD   1,000 mcg at 10/30/16 0848    Lab Results:  Results for orders placed or performed during the hospital encounter of 10/16/16 (from the past 48 hour(s))  Urinalysis, Complete w Microscopic     Status: Abnormal   Collection Time: 10/29/16  5:29 PM  Result Value Ref Range   Color, Urine YELLOW (A) YELLOW   APPearance HAZY (A) CLEAR   Specific Gravity, Urine 1.008 1.005 - 1.030   pH 6.0 5.0 - 8.0   Glucose, UA NEGATIVE NEGATIVE mg/dL   Hgb urine dipstick MODERATE (A) NEGATIVE   Bilirubin Urine NEGATIVE NEGATIVE   Ketones, ur NEGATIVE NEGATIVE mg/dL   Protein, ur NEGATIVE NEGATIVE mg/dL   Nitrite NEGATIVE NEGATIVE   Leukocytes, UA SMALL (A) NEGATIVE   RBC / HPF 0-5 0 - 5 RBC/hpf   WBC, UA 0-5 0 - 5 WBC/hpf   Bacteria, UA RARE (A) NONE SEEN   Squamous Epithelial / LPF 0-5 (A) NONE SEEN    Blood Alcohol level:  Lab Results  Component Value Date   ETH <5 44/81/8563    Metabolic Disorder Labs: Lab Results  Component Value Date   HGBA1C 6.0 (H) 10/17/2016   MPG 126 10/17/2016   No results found for: PROLACTIN Lab Results  Component Value Date   CHOL 166 10/17/2016   TRIG 117 10/17/2016   HDL 49 10/17/2016   CHOLHDL  3.4 10/17/2016   VLDL 23 10/17/2016   LDLCALC 94 10/17/2016    Physical Findings: AIMS: Facial and Oral Movements Muscles of Facial Expression: None, normal Lips and Perioral Area: None, normal Jaw: None, normal Tongue: None, normal,Extremity Movements Upper (arms, wrists, hands, fingers): None, normal Lower (legs, knees, ankles, toes): None, normal, Trunk Movements Neck, shoulders, hips: None, normal, Overall Severity Severity of abnormal movements (highest  score from questions above): None, normal Incapacitation due to abnormal movements: None, normal Kelsey's awareness of abnormal movements (rate only Kelsey's report): No Awareness, Dental Status Current problems with teeth and/or dentures?: No Does Kelsey usually wear dentures?: No  CIWA:    COWS:     Musculoskeletal: Strength & Muscle Tone: within normal limits Gait & Station: normal Kelsey leans: N/A  Psychiatric Specialty Exam: Physical Exam  Nursing note and vitals reviewed. Review   Review of Systems  Psychiatric/Behavioral: Positive for memory loss. The Kelsey is nervous/anxious.   All other systems reviewed and are negative.   Blood pressure (!) 137/95, pulse 96, temperature 98.4 F (36.9 C), temperature source Oral, resp. rate 18, height '4\' 9"'  (1.448 m), weight 67.1 kg (148 lb), SpO2 98 %.Body mass index is 32.03 kg/m.  General Appearance: Casual  Eye Contact:  Good  Speech:  Clear and Coherent  Volume:  Normal  Mood:  Anxious, Hopeless and Worthless  Affect:  Blunt  Thought Process:  Disorganized and Descriptions of Associations: Tangential  Orientation:  Full (Time, Place, and Person)  Thought Content:  Paranoid Ideation  Suicidal Thoughts:  No  Homicidal Thoughts:  No  Memory:  Immediate;   Fair Recent;   Fair Remote;   Fair  Judgement:  Impaired  Insight:  Lacking  Psychomotor Activity:  Decreased  Concentration:  Concentration: Poor and Attention Span: Poor  Recall:  Poor  Fund of  Knowledge:  Fair  Language:  Fair  Akathisia:  No  Handed:  Right  AIMS (if indicated):     Assets:  Communication Skills Desire for Improvement Housing Physical Health Resilience Social Support Vocational/Educational  ADL's:  Intact  Cognition:  WNL  Sleep:  Number of Hours: 6.45     Treatment Plan Summary: Daily contact with Kelsey to assess and evaluate symptoms and progress in treatment and Medication management   Ms. Vazguez is a 61 year old female with a history of depression admitted after presumed suicide attempt, that the Kelsey denies, confusion and thought disorganization.   1. Suicidal ideation. The Kelsey adamantly denies any thoughts, intentions, or plans to hurt herself or others.   2. Mood. We continue Luvox and Seroquel for depression and OCD.   3. Anxiety. Vistaril is available.    4. Insomnia. Improved with Seroquel.   5. HTN. She is on Maxzide.  6. Dyslipidemia. She is on Zocor.  7. Family conflict. Family contacted Social services.   8. Cognitive decline. Head CT scan shows mild small vessel disease. RPR and HIV negative. Psychological and OT consultations are greatly appreciated. They indicate mild cognitive impairment MOCA 21/30.    9. UTI. She completed a course of Keflex. Awaiting UCx. Started Macrobid.   10. B12 defficiancy. She completed 7 day course of B12 injections. She is on oral supplementation.    11. Disposition. The Kelsey will be discharged to home. She will follow up with her PCP. She needs psychiatry appointment and vocational coach.  Orson Slick, MD 10/30/2016, 9:24 AM

## 2016-10-30 NOTE — BHH Suicide Risk Assessment (Signed)
[facility] Discharge Suicide Risk Assessment   Principal Problem: Severe recurrent major depression without psychotic features [facility]) Discharge Diagnoses:  Patient Active Problem List   Diagnosis Date Noted  . Mild cognitive impairment [G31.84] 10/30/2016  . B12 deficiency [E53.8] 10/27/2016  . UTI (urinary tract infection) [N39.0] 10/17/2016  . OCD (obsessive compulsive disorder) [F42.9] 10/17/2016  . Severe recurrent major depression without psychotic features ([place]) [F33.2] 10/16/2016  . Hypertension [I10] 10/16/2016    Total Time spent with patient: 30 minutes  Musculoskeletal: Strength & Muscle Tone: within normal limits Gait & Station: normal Patient leans: N/A  Psychiatric Specialty Exam: Review of Systems  Psychiatric/Behavioral: Positive for memory loss. The patient is nervous/anxious.   All other systems reviewed and are negative.   Blood pressure (!) 137/95, pulse 96, temperature 98.4 F (36.9 C), temperature source Oral, resp. rate 18, height 4\' 9"  (1.448 m), weight 67.1 kg (148 lb), SpO2 98 %.Body mass index is 32.03 kg/m.  General Appearance: Casual  Eye Contact::  Good  Speech:  Clear and Coherent409  Volume:  Normal  Mood:  Anxious  Affect:  Appropriate  Thought Process:  Goal Directed and Descriptions of Associations: Intact  Orientation:  Full (Time, Place, and Person)  Thought Content:  WDL  Suicidal Thoughts:  No  Homicidal Thoughts:  No  Memory:  Immediate;   Fair Recent;   Fair Remote;   Fair  Judgement:  Impaired  Insight:  Shallow  Psychomotor Activity:  Normal  Concentration:  Fair  Recall:  [place]  Language: Fair  Akathisia:  No  Handed:  Right  AIMS (if indicated):     Assets:  Communication Skills Desire for Improvement Financial Resources/Insurance Housing Physical Health Resilience Social Support Transportation Vocational/Educational  Sleep:  Number of Hours: 6.45  Cognition: WNL  ADL's:  Intact   Mental  Status Per Nursing Assessment::   On Admission:  NA  Demographic Factors:  Divorced or widowed, Caucasian and Low socioeconomic status  Loss Factors: Loss of significant relationship and Financial problems/change in socioeconomic status  Historical Factors: Family history of mental illness or substance abuse and Impulsivity  Risk Reduction Factors:   Responsible for children under 26 years of age, Sense of responsibility to family, Employed, Living with another person, especially a relative, Positive social support and Positive therapeutic relationship  Continued Clinical Symptoms:  Depression:   Impulsivity Obsessive-Compulsive Disorder  Cognitive Features That Contribute To Risk:  None    Suicide Risk:  Minimal: No identifiable suicidal ideation.  Patients presenting with no risk factors but with morbid ruminations; may be classified as minimal risk based on the severity of the depressive symptoms  Follow-up [facility]. Go on 10/29/2016.   Why:  Please go to [facility] on 10/29/16 at 230pm.  Please bring ID, insurance information, and hospital discharge paperwork.  Any questions, call Sherrian Divers at [phone]. Contact information: [place] [address] [phone]           Plan Of Care/Follow-up recommendations:  Activity:  As tolerated Diet:  Low sodium heart healthy. Other:  Keep follow-up appointments.  Orson Slick, MD 10/30/2016, 11:36 AM

## 2016-10-30 NOTE — Progress Notes (Signed)
Patient ID: Kelsey Arroyo, female   DOB: 1956-08-10, 60 y.o.   MRN: [id number] Quiet, minimal interaction with peers, pleasant on approach in the day area "today is my birthday you know, [birth year] ..." A&Ox3, denied pain, happy to restart antibiotics for UTI, denied SI/HI, denied AV/H. Nursing staffs will continue to provide clinical and moral support.
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
## 2016-10-30 NOTE — BHH Group Notes (Signed)
[facility] LCSW Group Therapy Note  Date/Time: 10/30/16 1300  Type of Therapy/Topic:  Group Therapy:  Balance in Life  Participation Level:  moderate  Description of Group:    This group will address the concept of balance and how it feels and looks when one is unbalanced. Patients will be encouraged to process areas in their lives that are out of balance, and identify reasons for remaining unbalanced. Facilitators will guide patients utilizing problem- solving interventions to address and correct the stressor making their life unbalanced. Understanding and applying boundaries will be explored and addressed for obtaining  and maintaining a balanced life. Patients will be encouraged to explore ways to assertively make their unbalanced needs known to significant others in their lives, using other group members and facilitator for support and feedback.  Therapeutic Goals: 1. Patient will identify two or more emotions or situations they have that consume much of in their lives. 2. Patient will identify signs/triggers that life has become out of balance:  3. Patient will identify two ways to set boundaries in order to achieve balance in their lives:  4. Patient will demonstrate ability to communicate their needs through discussion and/or role plays  Summary of Patient Progress: pt reports that caring for grandparents, working and finances are areas of life that can get out of balance.  Client shared that setting boundaries with family is difficult for her.   Therapeutic Modalities:   Cognitive Behavioral Therapy Solution-Focused Therapy Assertiveness Training  Lurline Idol, [place]

## 2016-10-30 NOTE — BHH Group Notes (Signed)
[facility] Group Notes:  (Nursing/MHT/Case Management/Adjunct)  Date:  10/30/2016  Time:  6:11 PM  Type of Therapy:  Psychoeducational Skills  Participation Level:  Minimal  Participation Quality:  Attentive  Affect:  Appropriate  Cognitive:  Appropriate  Insight:  Appropriate  Engagement in Group:  Lacking  Modes of Intervention:  Activity  Summary of Progress/Problems:  Kelsey Arroyo 10/30/2016, 6:11 PM

## 2016-10-30 NOTE — Progress Notes (Signed)
  [facility] Adult Case Management Discharge Plan :  Will you be returning to the same living situation after discharge:  Yes,  with mother At discharge, do you have transportation home?: Yes,  daughter Do you have the ability to pay for your medications: No. Med management clinic referral made  Release of information consent forms completed and in the chart;  Patient's signature needed at discharge.  Patient to Follow up at: [facility]. Go on 10/29/2016.   Why:  Please go to [facility] on 10/29/16 at 230pm.  Please bring ID, insurance information, and hospital discharge paperwork.  Any questions, call Sherrian Divers at [phone]. Contact information: [place]  [phone]           Next level of care provider has access to [place] and Suicide Prevention discussed: Yes,  daughter  Have you used any form of tobacco in the last 30 days? (Cigarettes, Smokeless Tobacco, Cigars, and/or Pipes): No  Has patient been referred to the Quitline?: N/A patient is not a smoker  Patient has been referred for addiction treatment: N/A  Joanne Chars, LCSW 10/30/2016, 12:49 PM

## 2016-10-30 NOTE — Progress Notes (Signed)
D:Patient aware of discharge this shift . Patient returning home . Patient received all belonging locked up . Patient denies  Suicidal  And homicidal ideations  .  A: Writer instructed on discharge criteria  Patient stated she was not ready to leave. Daughter arrived on unit  . Informed of 7 day supply  Of medication Discharge Summary . Transitional Record  Suicide Risk Assesment  and prescriptions  given to patient  . Aware  Of follow up appointment ;to be called to her tomorrow  . R: Patient left unit with no questions  After review of information Or concerns  With daughter

## 2016-10-30 NOTE — Plan of Care (Signed)
Problem: Activity: Goal: Sleeping patterns will improve Outcome: Progressing Patient slept for Estimated Hours of 6.45; every 15 minutes round safety maintained, no injury or falls during this shift.

## 2016-10-30 NOTE — Progress Notes (Signed)
Recreation Therapy Notes  Date: 01.18.18 Time: 9:30 am Location: Craft Room  Group Topic: Leisure Education  Goal Area(s) Addresses:  Patient will identify things they are grateful for. Patient will identify how being grateful can influence decision making.  Behavioral Response: Attentive  Intervention: Immunologist  Activity: Patients were given an I Am Grateful For worksheet and were instructed to write things they are grateful for under each category.  Education: LRT educated patients on leisure and why it is important.  Education Outcome: In group clarification offered  Clinical Observations/Feedback: Patient did not participate in activity. Patient did not participate in group discussion.  Leonette Monarch, LRT/CTRS 10/30/2016 10:19 AM

## 2016-10-31 LAB — URINE CULTURE
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
## 2016-10-31 NOTE — BHH Group Notes (Signed)
Goals Group: late entry Date/Time: 10/30/17 9:00 AM Type of Therapy and Topic: Group Therapy: Goals Group: SMART Goals   Participation Level: Moderate  Description of Group:    The purpose of a daily goals group is to assist and guide patients in setting recovery/wellness-related goals. The objective is to set goals as they relate to the crisis in which they were admitted. Patients will be using SMART goal modalities to set measurable goals. Characteristics of realistic goals will be discussed and patients will be assisted in setting and processing how one will reach their goal. Facilitator will also assist patients in applying interventions and coping skills learned in psycho-education groups to the SMART goal and process how one will achieve defined goal.   Therapeutic Goals:   -Patients will develop and document one goal related to or their crisis in which brought them into treatment.  -Patients will be guided by LCSW using SMART goal setting modality in how to set a measurable, attainable, realistic and time sensitive goal.  -Patients will process barriers in reaching goal.  -Patients will process interventions in how to overcome and successful in reaching goal.   Patient's Goal: As she prepared for discharge, pt said her goal is to make sure she understands what medicines she is on so that she can stay organized with them at home.   Therapeutic Modalities:  Motivational Interviewing  Art gallery manager  SMART goals setting   Lurline Idol, [place]

## 2017-01-18 ENCOUNTER — Other Ambulatory Visit: Payer: Self-pay | Admitting: Psychiatry

## 2017-07-08 IMAGING — CT CT HEAD W/O CM
3 series · 15 of 45 positions shown, 18 images · non-contrast
Comparison: 03/22/2012

CLINICAL DATA: 60-year-old female with a history of mental distress

EXAM:
CT HEAD WITHOUT CONTRAST
TECHNIQUE: Contiguous axial images were obtained from the base of the skull
through the vertex without intravenous contrast.

[Series 2: head wo · axial · 0.47mm/px · z∈[-184,-69]mm · 9 of 28 slices shown, 12 images]
[im 3/28  brain]
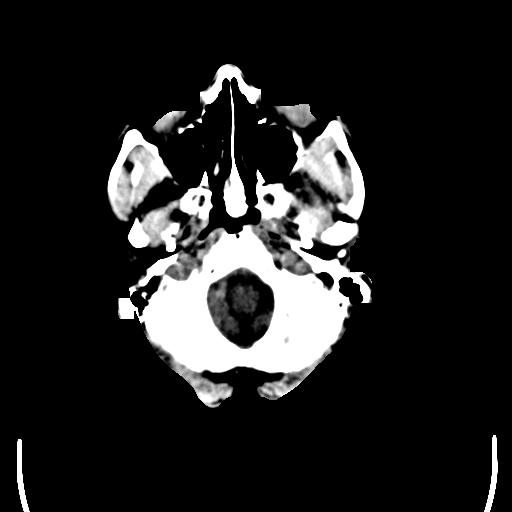
[im 3/28  bone]
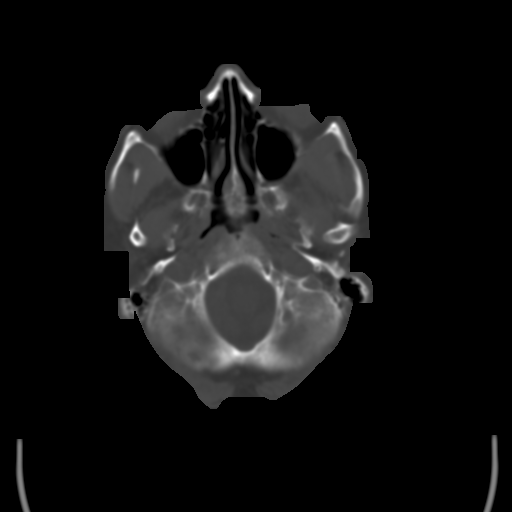
[im 6/28  brain]
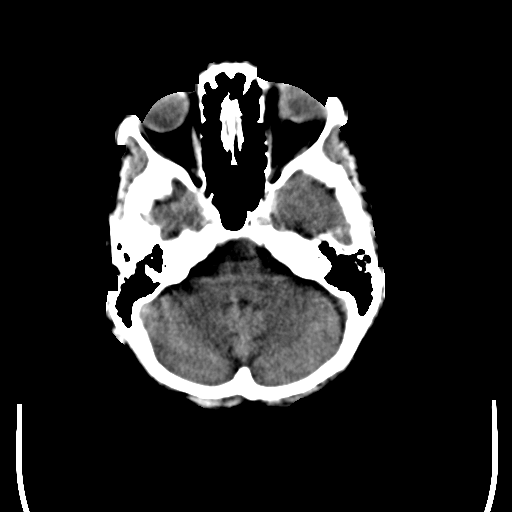
[im 9/28  brain]
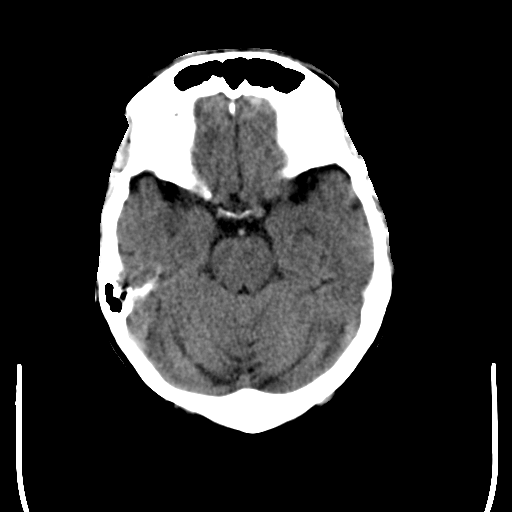
[im 12/28  brain]
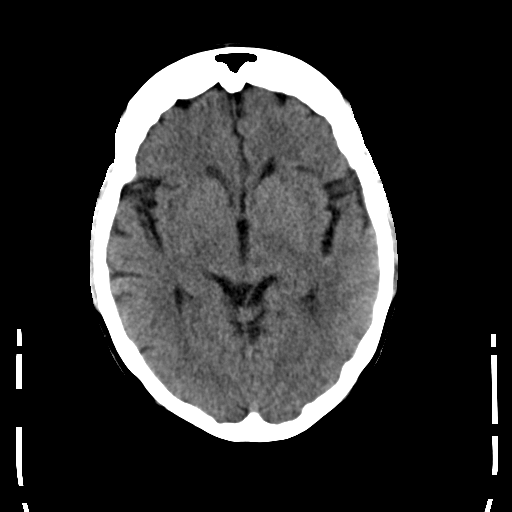
[im 15/28  brain]
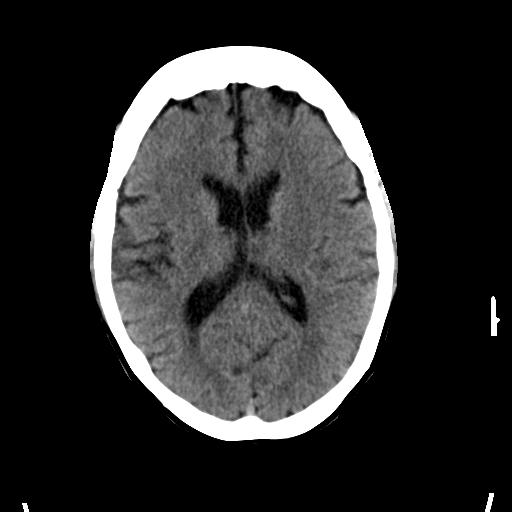
[im 15/28  bone]
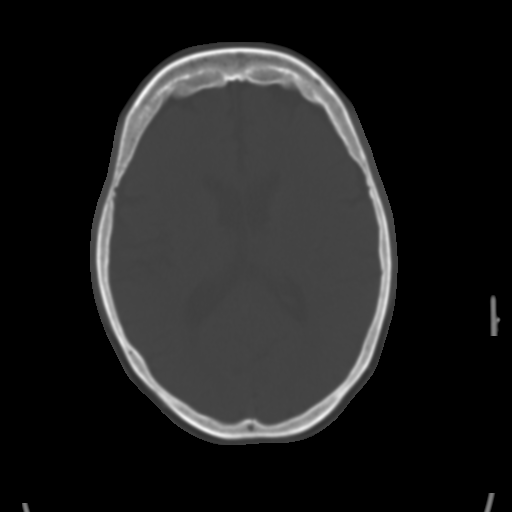
[im 17/28  brain]
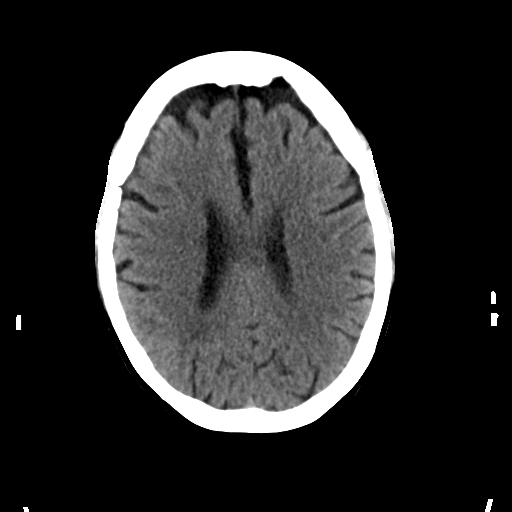
[im 20/28  brain]
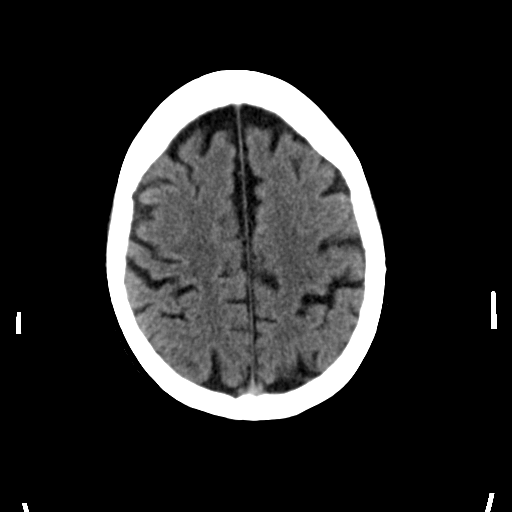
[im 23/28  brain]
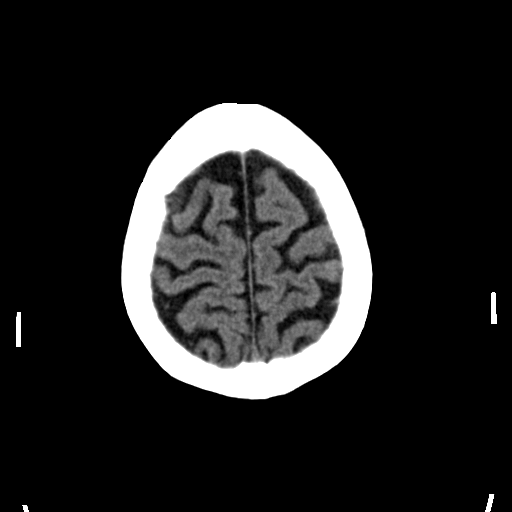
[im 26/28  brain]
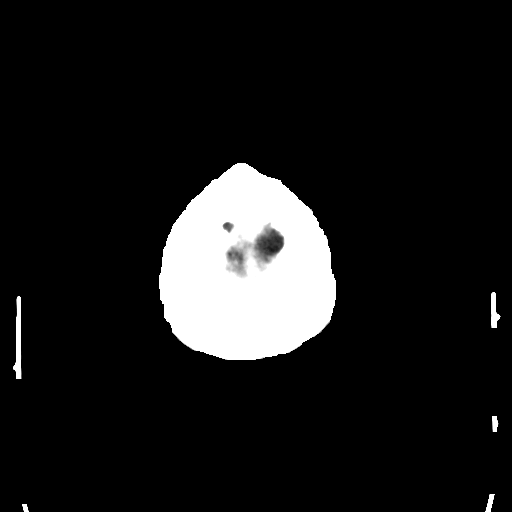
[im 26/28  bone]
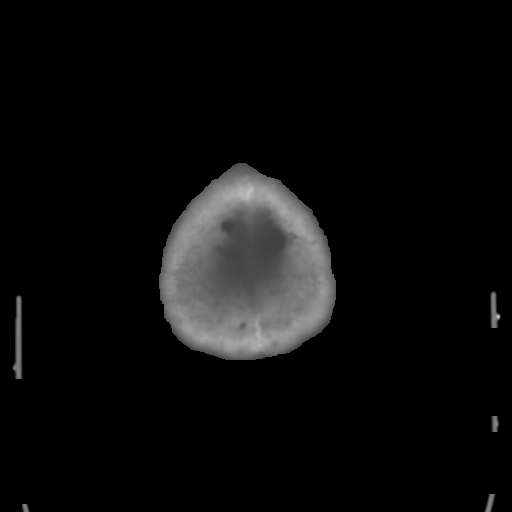

[Series 4: coronal soft · coronal · 0.28mm/px · 3 of 66 slices shown]
[im 22/66  brain]
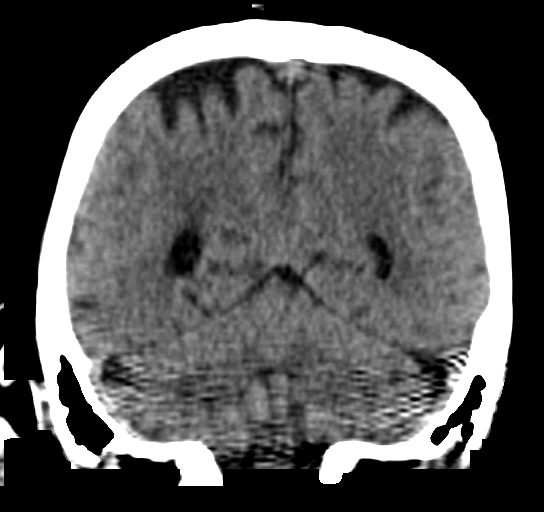
[im 29/66  brain]
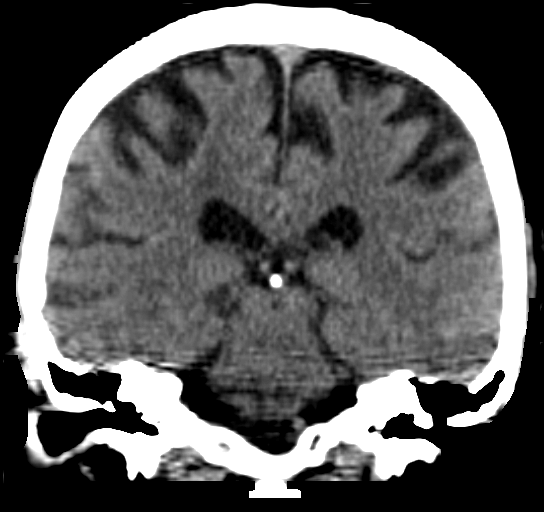
[im 37/66  brain]
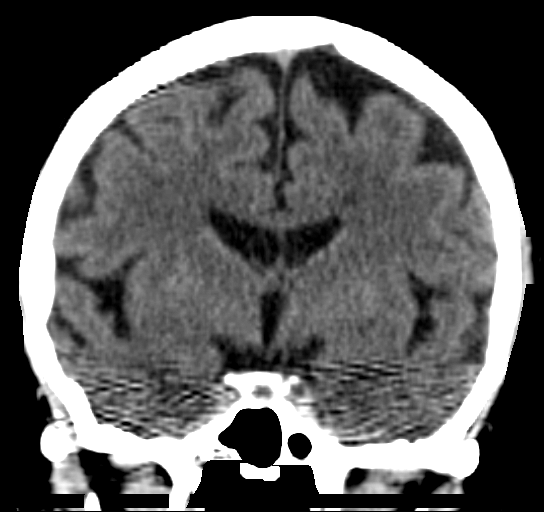

[Series 5: sagittal soft · sagittal · 0.28mm/px · 3 of 49 slices shown]
[im 17/49  brain]
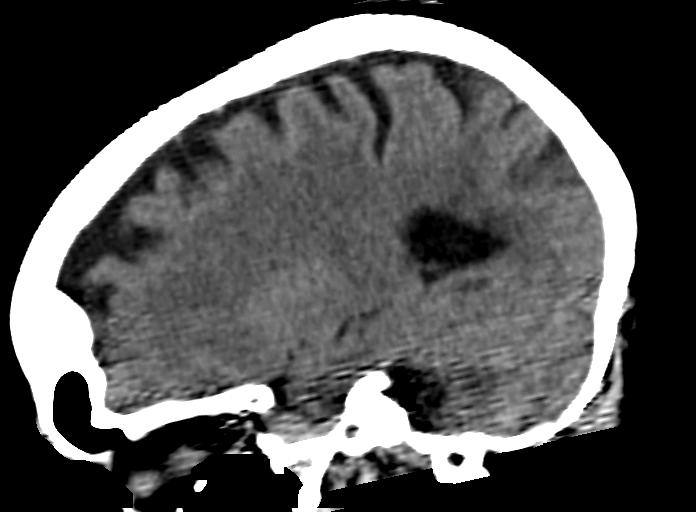
[im 25/49  brain]
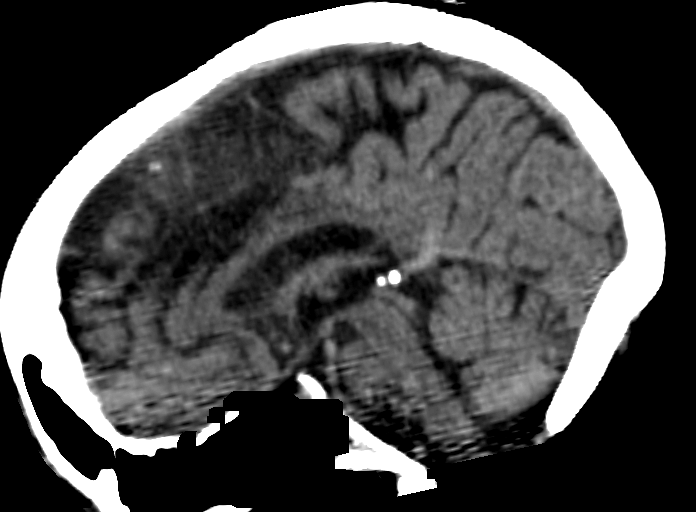
[im 33/49  brain]
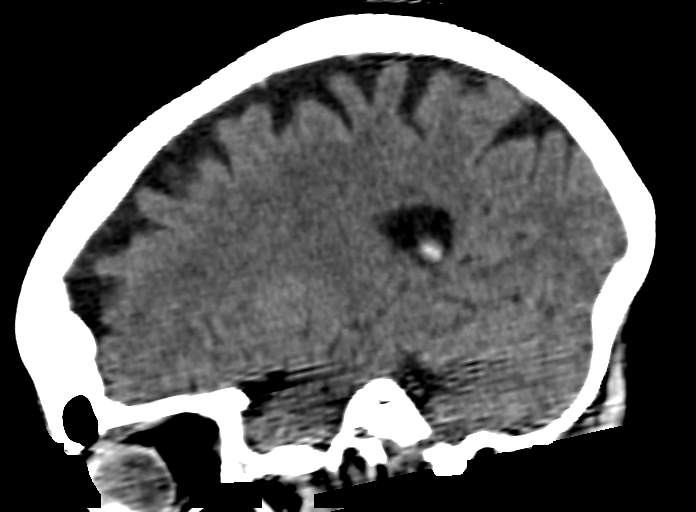

[15 of 45 positions shown; findings below may reference images not displayed]

FINDINGS: Unremarkable appearance of the calvarium without acute fracture or
aggressive lesion.

Unremarkable appearance of the scalp soft tissues.

Unremarkable appearance of the bilateral orbits.

Mastoid air cells are clear.

No significant paranasal sinus disease

No acute intracranial hemorrhage, midline shift, or mass effect.

Gray-white differentiation is maintained, without CT evidence of
acute ischemia.

Unremarkable configuration of the ventricles.

Mild brain volume loss, similar to the comparison.
IMPRESSION: No CT evidence of acute intracranial abnormality.

Similar appearance of parenchymal atrophy.

## 2018-05-19 ENCOUNTER — Other Ambulatory Visit: Payer: Self-pay | Admitting: Family Medicine

## 2018-05-19 DIAGNOSIS — Z1231 Encounter for screening mammogram for malignant neoplasm of breast: Secondary | ICD-10-CM

## 2018-12-08 ENCOUNTER — Ambulatory Visit (INDEPENDENT_AMBULATORY_CARE_PROVIDER_SITE_OTHER): Payer: Medicare Other | Admitting: Obstetrics and Gynecology

## 2018-12-08 ENCOUNTER — Other Ambulatory Visit (HOSPITAL_COMMUNITY)
Admission: RE | Admit: 2018-12-08 | Discharge: 2018-12-08 | Disposition: A | Discharge status: Home / Self Care | Payer: 59 | Source: Ambulatory Visit | Attending: Obstetrics and Gynecology | Admitting: Obstetrics and Gynecology

## 2018-12-08 ENCOUNTER — Encounter: Payer: Self-pay | Admitting: Obstetrics and Gynecology

## 2018-12-08 VITALS — BP 119/84 | HR 87 | Ht 60.0 in | Wt 148.4 lb

## 2018-12-08 DIAGNOSIS — G3184 Mild cognitive impairment, so stated: Secondary | ICD-10-CM

## 2018-12-08 DIAGNOSIS — Z85828 Personal history of other malignant neoplasm of skin: Secondary | ICD-10-CM

## 2018-12-08 DIAGNOSIS — F332 Major depressive disorder, recurrent severe without psychotic features: Secondary | ICD-10-CM | POA: Diagnosis not present

## 2018-12-08 DIAGNOSIS — H6123 Impacted cerumen, bilateral: Secondary | ICD-10-CM

## 2018-12-08 DIAGNOSIS — F429 Obsessive-compulsive disorder, unspecified: Secondary | ICD-10-CM | POA: Diagnosis not present

## 2018-12-08 DIAGNOSIS — E538 Deficiency of other specified B group vitamins: Secondary | ICD-10-CM

## 2018-12-08 DIAGNOSIS — Z01419 Encounter for gynecological examination (general) (routine) without abnormal findings: Secondary | ICD-10-CM | POA: Diagnosis not present

## 2018-12-08 NOTE — Patient Instructions (Signed)
Eucerine or Aveeno lotion daily
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
## 2018-12-09 ENCOUNTER — Other Ambulatory Visit: Payer: Self-pay | Admitting: Obstetrics and Gynecology

## 2018-12-09 DIAGNOSIS — E559 Vitamin D deficiency, unspecified: Secondary | ICD-10-CM | POA: Insufficient documentation

## 2018-12-09 LAB — CBC WITH DIFFERENTIAL/PLATELET
Basophils Absolute: 0.1 10*3/uL (ref 0.0–0.2)
Basos: 1 %
EOS (ABSOLUTE): 0.3 10*3/uL (ref 0.0–0.4)
EOS: 3 %
HEMATOCRIT: 44.1 % (ref 34.0–46.6)
Hemoglobin: 15.4 g/dL (ref 11.1–15.9)
Immature Grans (Abs): 0 10*3/uL (ref 0.0–0.1)
Immature Granulocytes: 0 %
Lymphocytes Absolute: 2.3 10*3/uL (ref 0.7–3.1)
Lymphs: 24 %
MCH: 29.3 pg (ref 26.6–33.0)
MCHC: 34.9 g/dL (ref 31.5–35.7)
MCV: 84 fL (ref 79–97)
MONOS ABS: 0.6 10*3/uL (ref 0.1–0.9)
Monocytes: 6 %
NEUTROS PCT: 66 %
Neutrophils Absolute: 6.3 10*3/uL (ref 1.4–7.0)
Platelets: 317 10*3/uL (ref 150–450)
RBC: 5.26 x10E6/uL (ref 3.77–5.28)
RDW: 12 % (ref 11.7–15.4)
WBC: 9.6 10*3/uL (ref 3.4–10.8)

## 2018-12-09 LAB — B12 AND FOLATE PANEL
Folate: 11.3 ng/mL (ref >3.0)
VITAMIN B 12: 370 pg/mL (ref 232–1245)

## 2018-12-09 LAB — VITAMIN D 25 HYDROXY (VIT D DEFICIENCY, FRACTURES): Vit D, 25-Hydroxy: 15.9 ng/mL — ABNORMAL LOW (ref 30.0–100.0)

## 2018-12-09 MED ORDER — VITAMIN D (ERGOCALCIFEROL) 1.25 MG (50000 UNIT) PO CAPS: 50000.0000 [IU] | ORAL_CAPSULE | ORAL | 2 refills | Status: DC

## 2018-12-13 ENCOUNTER — Telehealth: Payer: Self-pay | Admitting: Obstetrics and Gynecology

## 2018-12-13 NOTE — Telephone Encounter (Signed)
I called the patient and her daughter several times to notify patient of future appointments scheduled at ENT, and [facility]. Pt has not answered X6+ calls VM not set up. Left daughter VM Barrister's clerk). I have not received a response/call back. Thank you, -TH

## 2018-12-16 LAB — CYTOLOGY - PAP
DIAGNOSIS: NEGATIVE
HPV 16/18/45 GENOTYPING: NEGATIVE
HPV: DETECTED — AB

## 2018-12-17 ENCOUNTER — Telehealth: Payer: Self-pay | Admitting: *Deleted

## 2018-12-17 DIAGNOSIS — H6123 Impacted cerumen, bilateral: Secondary | ICD-10-CM | POA: Diagnosis not present

## 2018-12-17 DIAGNOSIS — H9193 Unspecified hearing loss, bilateral: Secondary | ICD-10-CM | POA: Diagnosis not present

## 2018-12-17 NOTE — Telephone Encounter (Signed)
-----   Message from Joylene Igo, [place] sent at 12/17/2018 11:10 AM EST ----- Please mail info on neg pap with HPV+

## 2018-12-17 NOTE — Telephone Encounter (Signed)
Mailed all info

## 2018-12-21 ENCOUNTER — Ambulatory Visit
Admission: RE | Admit: 2018-12-21 | Discharge: 2018-12-21 | Disposition: A | Discharge status: Home / Self Care | Payer: 59 | Source: Ambulatory Visit | Attending: Obstetrics and Gynecology | Admitting: Obstetrics and Gynecology

## 2018-12-21 DIAGNOSIS — Z01419 Encounter for gynecological examination (general) (routine) without abnormal findings: Secondary | ICD-10-CM

## 2018-12-21 DIAGNOSIS — H60332 Swimmer's ear, left ear: Secondary | ICD-10-CM | POA: Diagnosis not present

## 2018-12-21 DIAGNOSIS — H6122 Impacted cerumen, left ear: Secondary | ICD-10-CM | POA: Diagnosis not present

## 2018-12-21 DIAGNOSIS — Z1231 Encounter for screening mammogram for malignant neoplasm of breast: Secondary | ICD-10-CM | POA: Diagnosis not present

## 2019-03-25 ENCOUNTER — Telehealth: Payer: Self-pay | Admitting: Obstetrics and Gynecology

## 2019-03-25 NOTE — Telephone Encounter (Signed)
Patient called stating Dr Nicolasa Ducking does not accept her insurance. She would like a new referral to [facility]. Thanks

## 2019-03-31 ENCOUNTER — Telehealth: Payer: Self-pay | Admitting: Obstetrics and Gynecology

## 2019-03-31 ENCOUNTER — Other Ambulatory Visit: Payer: Self-pay | Admitting: *Deleted

## 2019-03-31 DIAGNOSIS — F332 Major depressive disorder, recurrent severe without psychotic features: Secondary | ICD-10-CM

## 2019-03-31 NOTE — Telephone Encounter (Signed)
See message.

## 2019-03-31 NOTE — Telephone Encounter (Signed)
Called pt referral has been placed advised pt to contact [facility] office.

## 2019-03-31 NOTE — Telephone Encounter (Signed)
The patient called and stated that Melody previously referred the patient to a "Pyschiatrist" and the patient waited too long to go and now the referring office no longer accepts her insurance. The patient is requesting to have another referral sent in for her. The patient is also requesting a call back to verify the referral has been placed. Please advise.

## 2019-05-02 ENCOUNTER — Ambulatory Visit: Admit: 2019-05-02 | Payer: 59 | Admitting: Ophthalmology

## 2019-05-02 SURGERY — PHACOEMULSIFICATION, CATARACT, WITH IOL INSERTION
Anesthesia: Topical | Laterality: Right

## 2019-07-07 DIAGNOSIS — F329 Major depressive disorder, single episode, unspecified: Secondary | ICD-10-CM | POA: Diagnosis not present

## 2019-07-07 DIAGNOSIS — E785 Hyperlipidemia, unspecified: Secondary | ICD-10-CM | POA: Diagnosis not present

## 2019-07-07 DIAGNOSIS — F419 Anxiety disorder, unspecified: Secondary | ICD-10-CM | POA: Diagnosis not present

## 2019-07-07 DIAGNOSIS — R8290 Unspecified abnormal findings in urine: Secondary | ICD-10-CM | POA: Diagnosis not present

## 2019-07-07 DIAGNOSIS — I10 Essential (primary) hypertension: Secondary | ICD-10-CM | POA: Diagnosis not present

## 2021-01-10 ENCOUNTER — Ambulatory Visit: Payer: Medicare Other | Admitting: Dermatology

## 2022-06-05 ENCOUNTER — Encounter: Payer: Self-pay | Admitting: Psychiatry

## 2022-06-05 ENCOUNTER — Other Ambulatory Visit
Admission: RE | Admit: 2022-06-05 | Discharge: 2022-06-05 | Disposition: A | Discharge status: Home / Self Care | Payer: Medicare Other | Source: Ambulatory Visit | Attending: Psychiatry | Admitting: Psychiatry

## 2022-06-05 ENCOUNTER — Ambulatory Visit (INDEPENDENT_AMBULATORY_CARE_PROVIDER_SITE_OTHER): Payer: Medicare Other | Admitting: Psychiatry

## 2022-06-05 VITALS — BP 135/82 | HR 111 | Temp 98.3°F | Ht 58.47 in | Wt 141.4 lb

## 2022-06-05 DIAGNOSIS — F332 Major depressive disorder, recurrent severe without psychotic features: Secondary | ICD-10-CM | POA: Diagnosis present

## 2022-06-05 DIAGNOSIS — G4700 Insomnia, unspecified: Secondary | ICD-10-CM

## 2022-06-05 LAB — TSH: TSH: 3.63 u[IU]/mL (ref 0.350–4.500)

## 2022-06-05 MED ORDER — TRAZODONE HCL 50 MG PO TABS: 25.0000 mg | ORAL_TABLET | Freq: Every day | ORAL | 1 refills | Status: DC

## 2022-06-05 MED ORDER — FLUOXETINE HCL 20 MG PO CAPS: 20.0000 mg | ORAL_CAPSULE | Freq: Every day | ORAL | 1 refills | Status: DC
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
## 2022-06-06 ENCOUNTER — Encounter: Payer: Self-pay | Admitting: Psychiatry

## 2022-06-06 ENCOUNTER — Telehealth (HOSPITAL_COMMUNITY): Payer: Self-pay | Admitting: Psychiatry

## 2022-06-09 ENCOUNTER — Telehealth (HOSPITAL_COMMUNITY): Payer: Self-pay | Admitting: Psychiatry

## 2022-06-09 NOTE — Telephone Encounter (Signed)
D:  Dr. Modesta Messing referred patient to [facility].  A:  Placed call to orient pt.  Patient is declining at this time.  "My sister has my laptop and she's trying to update it because it's very old."  Pt states once that is complete she will be interested in the program.  Encouraged pt to call the case manager back once the computer is finished.  Also, mentioned to patient that she could call in if that would work better for her.  Inform Dr. Modesta Messing.

## 2022-07-26 NOTE — Progress Notes (Deleted)
BH MD/PA/NP OP Progress Note  07/26/2022 9:14 AM Kelsey Arroyo  MRN:  [id number]  Chief Complaint: No chief complaint on file.  HPI: *** Visit Diagnosis: No diagnosis found.  Past Psychiatric History: Please see initial evaluation for full details. I have reviewed the history. No updates at this time.     Past Medical History:  Past Medical History:  Diagnosis Date   Depression    Hyperlipemia    Hypertension    Skin cancer     Past Surgical History:  Procedure Laterality Date   CESAREAN SECTION     x2    Family Psychiatric History: Please see initial evaluation for full details. I have reviewed the history. No updates at this time.     Family History:  Family History  Problem Relation Age of Onset   Cancer Maternal Grandmother     Social History:  Social History   Socioeconomic History   Marital status: Divorced    Spouse name: Not on file   Number of children: 4   Years of education: Not on file   Highest education level: High school graduate  Occupational History   Not on file  Tobacco Use   Smoking status: Never   Smokeless tobacco: Never  Vaping Use   Vaping Use: Never used  Substance and Sexual Activity   Alcohol use: No   Drug use: No   Sexual activity: Not Currently  Other Topics Concern   Not on file  Social History Narrative   Not on file   Social Determinants of Health   Financial Resource Strain: Not on file  Food Insecurity: Not on file  Transportation Needs: Not on file  Physical Activity: Not on file  Stress: Not on file  Social Connections: Not on file    Allergies: No Known Allergies  Metabolic Disorder Labs: Lab Results  Component Value Date   HGBA1C 6.0 (H) 10/17/2016   MPG 126 10/17/2016   No results found for: "PROLACTIN" Lab Results  Component Value Date   CHOL 166 10/17/2016   TRIG 117 10/17/2016   HDL 49 10/17/2016   CHOLHDL 3.4 10/17/2016   VLDL 23 10/17/2016   [place] 94 10/17/2016   Lab Results   Component Value Date   TSH 3.630 06/05/2022   TSH 3.015 10/17/2016    Therapeutic Level Labs: No results found for: "LITHIUM" No results found for: "VALPROATE" No results found for: "CBMZ"  Current Medications: Current Outpatient Medications  Medication Sig Dispense Refill   buPROPion (WELLBUTRIN XL) 150 MG 24 hr tablet Take 150 mg by mouth daily.     FLUoxetine (PROZAC) 20 MG capsule Take 1 capsule (20 mg total) by mouth daily. 30 capsule 1   simvastatin (ZOCOR) 10 MG tablet Take 10 mg by mouth daily.     traZODone (DESYREL) 50 MG tablet Take 0.5-1 tablets (25-50 mg total) by mouth at bedtime. 30 tablet 1   triamterene-hydrochlorothiazide (MAXZIDE-25) 37.5-25 MG tablet Take 1 tablet by mouth daily.     No current facility-administered medications for this visit.     Musculoskeletal: Strength & Muscle Tone: within normal limits Gait & Station: normal Patient leans: N/A  Psychiatric Specialty Exam: Review of Systems  There were no vitals taken for this visit.There is no height or weight on file to calculate BMI.  General Appearance: {Appearance:22683}  Eye Contact:  {BHH EYE CONTACT:22684}  Speech:  Clear and Coherent  Volume:  Normal  Mood:  {BHH MOOD:22306}  Affect:  {Affect (PAA):22687}  Thought Process:  Coherent  Orientation:  Full (Time, Place, and Person)  Thought Content: Logical   Suicidal Thoughts:  {ST/HT (PAA):22692}  Homicidal Thoughts:  {ST/HT (PAA):22692}  Memory:  Immediate;   Good  Judgement:  {Judgement (PAA):22694}  Insight:  {Insight (PAA):22695}  Psychomotor Activity:  Normal  Concentration:  Concentration: Good and Attention Span: Good  Recall:  Good  Fund of Knowledge: Good  Language: Good  Akathisia:  No  Handed:  Right  AIMS (if indicated): not done  Assets:  Communication Skills Desire for Improvement  ADL's:  Intact  Cognition: WNL  Sleep:  {BHH GOOD/FAIR/POOR:22877}   Screenings: AIMS    Flowsheet Row Admission (Discharged)  from 10/16/2016 in [place] Total Score 0      AUDIT    [place] Admission (Discharged) from 10/16/2016 in [facility]  Alcohol Use Disorder Identification Test Final Score (AUDIT) 0      GAD-7    Flowsheet Row Office Visit from 06/05/2022 in [place]  Total GAD-7 Score 21      PHQ2-9    [place] Office Visit from 06/05/2022 in [place]  PHQ-2 Total Score 6  PHQ-9 Total Score 24      [place] Visit from 06/05/2022 in [place]  C-SSRS RISK CATEGORY No Risk        Assessment and Plan:  Kelsey Arroyo is a 66 y.o. year old female with a history of depression, hypertension, hyperlipidemia,, who presents for follow up appointment for below.      1. Severe episode of recurrent major depressive disorder, without psychotic features ([place]) She reports depressive symptoms in the context of conflict with her daughter at home, and employment/terminal issues of work after being employed for 21 years.  She reports good support from her sister, and is actively trying to find a job/housing.  Will switch from Paxil to fluoxetine given she has been on this medication for many years with unknown benefit.  Noted that she has tachycardia on today's exam.  Will hold uptitration of bupropion or SNRI at this time.  She will also not a good candidate for Lexapro at this time given his concern of QTc prolongation/her age.  Discussed potential risk of discontinuation symptoms and serotonin syndrome.  Will obtain blood test to rule out medical health issues contributing to her symptoms.  She will greatly benefit from IOP; will make referral.    # Insomnia She has initial insomnia.  Will start trazodone as needed for insomnia.  Discussed potential risk of drowsiness.    Plan Start fluoxetine 20 mg daily  Decrease Paxil 40 mg daily for  one week, then 20 mg daily for one week, then discontinue Continue bupropion 150 mg daily  Start trazodone 25-50 mg at night as needed for insomnia Obtain TSH Next appointment: 10/16 at 3:30 for 30 mins, in person Referral to IOP     The patient demonstrates the following risk factors for suicide: Chronic risk factors for suicide include: psychiatric disorder of depression . Acute risk factors for suicide include: family or marital conflict and unemployment. Protective factors for this patient include: positive social support, responsibility to others (children, family), and hope for the future. Considering these factors, the overall suicide risk at this point appears to be low. Patient is appropriate for outpatient follow up.  Collaboration of Care: Other reviewed notes in [place] of  Care: Collaboration of Care: {BH OP Collaboration of [facility]:[id number]}  Patient/Guardian was advised Release of Information must be obtained prior to any record release in order to collaborate their care with an outside provider. Patient/Guardian was advised if they have not already done so to contact the registration department to sign all necessary forms in order for [place] to release information regarding their care.   Consent: Patient/Guardian gives verbal consent for treatment and assignment of benefits for services provided during this visit. Patient/Guardian expressed understanding and agreed to proceed.    Norman Clay, MD 07/26/2022, 9:14 AM

## 2022-07-28 ENCOUNTER — Ambulatory Visit: Payer: Medicare Other | Admitting: Psychiatry

## 2022-08-04 NOTE — Progress Notes (Signed)
BH MD/PA/NP OP Progress Note  08/05/2022 3:41 PM Kelsey Arroyo  MRN:  [id number]  Chief Complaint:  Chief Complaint  Patient presents with   Follow-up   HPI:  This is a follow-up appointment for depression.  She presented late for the appointment.  She states that she asked 50 people to come from the parking lot.  She states that she is trying to find a job.  Most of the job requires online application.  Her sister told her to help her for this.  She stays in the house, watching TV most of the time.  She tries not let her daughter or grandson see her.  She does not go outside either as she does not have anything to do.  She not interested in taking a walk, stated that she has tried years ago.  She feels extremely depressed about anything.  She denies SI.  She brought her medication bottles for review.  She states that she does not take this medication consistently.  Although fluoxetine was prescribed 2 months ago, she still has medication left.  She is willing to take it regularly.  She denies alcohol use, drug use or cigarette use.   Support: sister Household: daughter, her grandson  Marital status:divorced, married twice.  She is not interested in a relationship, stating that although she was engaged twice, they both died.  Number of children: 4  Employment:  unemployed.  Used to work at [employer]; she was terminated, being accused of stealing chips and drink Education:   Last PCP / ongoing medical evaluation:   She states that she hated her mother.  She talks about an example of being told by her mother that water bill was cheaper when she was admitted to the hospital for depression.  She reports good relationship with her stepfather.  Her father left when she was 41-year-old.  He was later found out to be dead on railroad [place].  The death did not matter to her. She got married when she was age 66.    Visit Diagnosis:    ICD-10-CM   1. Severe episode of recurrent major depressive  disorder, without psychotic features ([place])  F33.2       Past Psychiatric History: Please see initial evaluation for full details. I have reviewed the history. No updates at this time.     Past Medical History:  Past Medical History:  Diagnosis Date   Depression    Hyperlipemia    Hypertension    Skin cancer     Past Surgical History:  Procedure Laterality Date   CESAREAN SECTION     x2    Family Psychiatric History: Please see initial evaluation for full details. I have reviewed the history. No updates at this time.     Family History:  Family History  Problem Relation Age of Onset   Cancer Maternal Grandmother     Social History:  Social History   Socioeconomic History   Marital status: Divorced    Spouse name: Not on file   Number of children: 4   Years of education: Not on file   Highest education level: High school graduate  Occupational History   Not on file  Tobacco Use   Smoking status: Never   Smokeless tobacco: Never  Vaping Use   Vaping Use: Never used  Substance and Sexual Activity   Alcohol use: No   Drug use: No   Sexual activity: Not Currently  Other Topics Concern   Not on  file  Social History Narrative   Not on file   Social Determinants of Health   Financial Resource Strain: Not on file  Food Insecurity: Not on file  Transportation Needs: Not on file  Physical Activity: Not on file  Stress: Not on file  Social Connections: Not on file    Allergies: No Known Allergies  Metabolic Disorder Labs: Lab Results  Component Value Date   HGBA1C 6.0 (H) 10/17/2016   MPG 126 10/17/2016   No results found for: "PROLACTIN" Lab Results  Component Value Date   CHOL 166 10/17/2016   TRIG 117 10/17/2016   HDL 49 10/17/2016   CHOLHDL 3.4 10/17/2016   VLDL 23 10/17/2016   LDLCALC 94 10/17/2016   Lab Results  Component Value Date   TSH 3.630 06/05/2022   TSH 3.015 10/17/2016    Therapeutic Level Labs: No results found for:  "LITHIUM" No results found for: "VALPROATE" No results found for: "CBMZ"  Current Medications: Current Outpatient Medications  Medication Sig Dispense Refill   buPROPion (WELLBUTRIN XL) 150 MG 24 hr tablet Take 150 mg by mouth daily.     FLUoxetine (PROZAC) 20 MG capsule Take 1 capsule (20 mg total) by mouth daily. 30 capsule 1   simvastatin (ZOCOR) 10 MG tablet Take 10 mg by mouth daily.     triamterene-hydrochlorothiazide (MAXZIDE-25) 37.5-25 MG tablet Take 1 tablet by mouth daily.     No current facility-administered medications for this visit.     Musculoskeletal: Strength & Muscle Tone: within normal limits Gait & Station: normal Patient leans: N/A  Psychiatric Specialty Exam: Review of Systems  All other systems reviewed and are negative.   Blood pressure (!) 142/83, pulse 99, temperature 98.3 F (36.8 C), temperature source Temporal, weight 147 lb (66.7 kg).Body mass index is 30.24 kg/m.  General Appearance: Fairly Groomed  Eye Contact:  Good  Speech:  Clear and Coherent  Volume:  Normal  Mood:   same  Affect:  Appropriate, Congruent, and calm  Thought Process:  Coherent  Orientation:  Full (Time, Place, and Person)  Thought Content: Logical   Suicidal Thoughts:  No  Homicidal Thoughts:  No  Memory:  Immediate;   Good  Judgement:  Good  Insight:  Fair  Psychomotor Activity:  Normal  Concentration:  Concentration: Good and Attention Span: Good  Recall:  Good  Fund of Knowledge: Good  Language: Good  Akathisia:  No  Handed:  Right  AIMS (if indicated): not done  Assets:  Communication Skills Desire for Improvement  ADL's:  Intact  Cognition: WNL  Sleep:  Poor   Screenings: AIMS    Flowsheet Row Admission (Discharged) from 10/16/2016 in [place] Total Score 0      AUDIT    [place] Admission (Discharged) from 10/16/2016 in [place] [facility]  Alcohol Use Disorder Identification Test Final Score  (AUDIT) 0      GAD-7    Flowsheet Row Office Visit from 08/05/2022 in [place] Office Visit from 06/05/2022 in [facility]  Total GAD-7 Score 21 21      PHQ2-9    [place] Office Visit from 08/05/2022 in [facility] Office Visit from 06/05/2022 in [facility]  PHQ-2 Total Score 6 6  PHQ-9 Total Score 24 [place] Office Visit from 06/05/2022 in [facility] No Risk  Assessment and Plan:  Kelsey Arroyo is a 66 y.o. year old female with a history of depression, hypertension, hyperlipidemia, who presents for follow up appointment for below.   1. Severe episode of recurrent major depressive disorder, without psychotic features ([place]) Exam is notable for calm demeanor despite reported depressive symptoms and anxiety.  Psychosocial stressors includes conflict with her daughter at home, and employment/terminal issues of work after being employed for 21 years.  She reports good support from her sister, and is actively trying to find a job/housing.  She is not adherent to medication, although she is willing to work on this.  Will start fluoxetine to target depression.  Will discontinue Paxil without reporting down given she was not taking it consistently either.  Will continue bupropion to target depression.  Although she was referred to IOP, she reports difficulty in doing virtual visit as she needs to use her sister's equipment.  Will make referral again when she is able to engage in this.    # Insomnia She has initial insomnia.  Although trazodone was previously prescribed, she is unsure of its beneift.  Will hold this medication at this time.   Plan Start fluoxetine 20 mg daily (she has medication bottle as she was not taking it consistently) Discontinue Paxil (was not taking it  consistently) Continue bupropion 150 mg daily  Hold Trazodone Next appointment: 12/19 at 2:30 for 30 mins, in person     The patient demonstrates the following risk factors for suicide: Chronic risk factors for suicide include: psychiatric disorder of depression . Acute risk factors for suicide include: family or marital conflict and unemployment. Protective factors for this patient include: positive social support, responsibility to others (children, family), and hope for the future. Considering these factors, the overall suicide risk at this point appears to be low. Patient is appropriate for outpatient follow up.      Collaboration of Care: Collaboration of Care: Other reviewed notes in Epic  Patient/Guardian was advised Release of Information must be obtained prior to any record release in order to collaborate their care with an outside provider. Patient/Guardian was advised if they have not already done so to contact the registration department to sign all necessary forms in order for [place] to release information regarding their care.   Consent: Patient/Guardian gives verbal consent for treatment and assignment of benefits for services provided during this visit. Patient/Guardian expressed understanding and agreed to proceed.    Norman Clay, MD 08/05/2022, 3:41 PM

## 2022-08-05 ENCOUNTER — Ambulatory Visit (INDEPENDENT_AMBULATORY_CARE_PROVIDER_SITE_OTHER): Payer: Medicare Other | Admitting: Psychiatry

## 2022-08-05 ENCOUNTER — Encounter: Payer: Self-pay | Admitting: Psychiatry

## 2022-08-05 ENCOUNTER — Ambulatory Visit: Payer: Medicare Other | Admitting: Psychiatry

## 2022-08-05 VITALS — BP 142/83 | HR 99 | Temp 98.3°F | Wt 147.0 lb

## 2022-08-05 DIAGNOSIS — F332 Major depressive disorder, recurrent severe without psychotic features: Secondary | ICD-10-CM

## 2022-08-05 NOTE — Patient Instructions (Addendum)
Start fluoxetine 20 mg daily  Discontinue Paxil  Continue bupropion 150 mg daily  Hold Trazodone Next appointment: 12/19 at 2:30

## 2022-08-21 ENCOUNTER — Other Ambulatory Visit: Payer: Self-pay | Admitting: Family Medicine

## 2022-08-21 DIAGNOSIS — Z1231 Encounter for screening mammogram for malignant neoplasm of breast: Secondary | ICD-10-CM

## 2022-09-28 NOTE — Progress Notes (Deleted)
BH MD/PA/NP OP Progress Note  09/28/2022 10:33 AM Kelsey Arroyo  MRN:  [id number]  Chief Complaint: No chief complaint on file.  HPI: *** Visit Diagnosis: No diagnosis found.  Past Psychiatric History: Please see initial evaluation for full details. I have reviewed the history. No updates at this time.     Past Medical History:  Past Medical History:  Diagnosis Date   Depression    Hyperlipemia    Hypertension    Skin cancer     Past Surgical History:  Procedure Laterality Date   CESAREAN SECTION     x2    Family Psychiatric History: Please see initial evaluation for full details. I have reviewed the history. No updates at this time.     Family History:  Family History  Problem Relation Age of Onset   Cancer Maternal Grandmother     Social History:  Social History   Socioeconomic History   Marital status: Divorced    Spouse name: Not on file   Number of children: 4   Years of education: Not on file   Highest education level: High school graduate  Occupational History   Not on file  Tobacco Use   Smoking status: Never   Smokeless tobacco: Never  Vaping Use   Vaping Use: Never used  Substance and Sexual Activity   Alcohol use: No   Drug use: No   Sexual activity: Not Currently  Other Topics Concern   Not on file  Social History Narrative   Not on file   Social Determinants of Health   Financial Resource Strain: Not on file  Food Insecurity: Not on file  Transportation Needs: Not on file  Physical Activity: Not on file  Stress: Not on file  Social Connections: Not on file    Allergies: No Known Allergies  Metabolic Disorder Labs: Lab Results  Component Value Date   HGBA1C 6.0 (H) 10/17/2016   MPG 126 10/17/2016   No results found for: "PROLACTIN" Lab Results  Component Value Date   CHOL 166 10/17/2016   TRIG 117 10/17/2016   HDL 49 10/17/2016   CHOLHDL 3.4 10/17/2016   VLDL 23 10/17/2016   [place] 94 10/17/2016   Lab Results   Component Value Date   TSH 3.630 06/05/2022   TSH 3.015 10/17/2016    Therapeutic Level Labs: No results found for: "LITHIUM" No results found for: "VALPROATE" No results found for: "CBMZ"  Current Medications: Current Outpatient Medications  Medication Sig Dispense Refill   buPROPion (WELLBUTRIN XL) 150 MG 24 hr tablet Take 150 mg by mouth daily.     FLUoxetine (PROZAC) 20 MG capsule Take 1 capsule (20 mg total) by mouth daily. 30 capsule 1   simvastatin (ZOCOR) 10 MG tablet Take 10 mg by mouth daily.     triamterene-hydrochlorothiazide (MAXZIDE-25) 37.5-25 MG tablet Take 1 tablet by mouth daily.     No current facility-administered medications for this visit.     Musculoskeletal: Strength & Muscle Tone: within normal limits Gait & Station: normal Patient leans: N/A  Psychiatric Specialty Exam: Review of Systems  There were no vitals taken for this visit.There is no height or weight on file to calculate BMI.  General Appearance: {Appearance:22683}  Eye Contact:  {BHH EYE CONTACT:22684}  Speech:  Clear and Coherent  Volume:  Normal  Mood:  {BHH MOOD:22306}  Affect:  {Affect (PAA):22687}  Thought Process:  Coherent  Orientation:  Full (Time, Place, and Person)  Thought Content: Logical   Suicidal  Thoughts:  {ST/HT (PAA):22692}  Homicidal Thoughts:  {ST/HT (PAA):22692}  Memory:  Immediate;   Good  Judgement:  {Judgement (PAA):22694}  Insight:  {Insight (PAA):22695}  Psychomotor Activity:  Normal  Concentration:  Concentration: Good and Attention Span: Good  Recall:  Good  Fund of Knowledge: Good  Language: Good  Akathisia:  No  Handed:  Right  AIMS (if indicated): not done  Assets:  Communication Skills Desire for Improvement  ADL's:  Intact  Cognition: WNL  Sleep:  {BHH GOOD/FAIR/POOR:22877}   Screenings: AIMS    Flowsheet Row Admission (Discharged) from 10/16/2016 in [place] Total Score 0      AUDIT    Flowsheet Row  Admission (Discharged) from 10/16/2016 in [facility]  Alcohol Use Disorder Identification Test Final Score (AUDIT) 0      GAD-7    Flowsheet Row Office Visit from 08/05/2022 in [place] Office Visit from 06/05/2022 in [facility]  Total GAD-7 Score 21 21      PHQ2-9    [place] Visit from 08/05/2022 in [place] Office Visit from 06/05/2022 in [place]  PHQ-2 Total Score 6 6  PHQ-9 Total Score 24 [place] Office Visit from 06/05/2022 in [place] CATEGORY No Risk        Assessment and Plan:  Kelsey Arroyo is a 66 y.o. year old female with a history of  depression, hypertension, hyperlipidemia , who presents for follow up appointment for below.    1. Severe episode of recurrent major depressive disorder, without psychotic features ([place]) Exam is notable for calm demeanor despite reported depressive symptoms and anxiety.  Psychosocial stressors includes conflict with her daughter at home, and employment/terminal issues of work after being employed for 21 years.  She reports good support from her sister, and is actively trying to find a job/housing.  She is not adherent to medication, although she is willing to work on this.  Will start fluoxetine to target depression.  Will discontinue Paxil without reporting down given she was not taking it consistently either.  Will continue bupropion to target depression.  Although she was referred to IOP, she reports difficulty in doing virtual visit as she needs to use her sister's equipment.  Will make referral again when she is able to engage in this.    # Insomnia She has initial insomnia.  Although trazodone was previously prescribed, she is unsure of its beneift.  Will hold this medication at this time.    Plan 1. Start  fluoxetine 20 mg daily (she has medication bottle as she was not taking it consistently) 2. Discontinue Paxil (was not taking it consistently) 3. Continue bupropion 150 mg daily  4. Hold Trazodone 5. Next appointment: 12/19 at 2:30 for 30 mins, in person     The patient demonstrates the following risk factors for suicide: Chronic risk factors for suicide include: psychiatric disorder of depression . Acute risk factors for suicide include: family or marital conflict and unemployment. Protective factors for this patient include: positive social support, responsibility to others (children, family), and hope for the future. Considering these factors, the overall suicide risk at this point appears to be low. Patient is appropriate for outpatient follow up.            Collaboration of Care: Collaboration of Care: {BH OP Collaboration of [facility]  Patient/Guardian  was advised Release of Information must be obtained prior to any record release in order to collaborate their care with an outside provider. Patient/Guardian was advised if they have not already done so to contact the registration department to sign all necessary forms in order for [place] to release information regarding their care.   Consent: Patient/Guardian gives verbal consent for treatment and assignment of benefits for services provided during this visit. Patient/Guardian expressed understanding and agreed to proceed.    Norman Clay, MD 09/28/2022, 10:33 AM

## 2022-09-30 ENCOUNTER — Ambulatory Visit: Payer: Medicare Other | Admitting: Psychiatry

## 2023-02-19 ENCOUNTER — Encounter: Payer: Self-pay | Admitting: Ophthalmology

## 2023-02-19 NOTE — Anesthesia Preprocedure Evaluation (Addendum)
Anesthesia Evaluation  Patient identified by MRN, date of birth, ID band Patient awake    Reviewed: Allergy & Precautions, H&P , NPO status , Patient's Chart, lab work & pertinent test results  Airway Mallampati: III  TM Distance: <3 FB Neck ROM: Full  Mouth opening: Limited Mouth Opening  Dental  (+) Poor Dentition   Pulmonary neg pulmonary ROS   Pulmonary exam normal breath sounds clear to auscultation       Cardiovascular hypertension, Normal cardiovascular exam Rhythm:Regular Rate:Normal  Patient felt OK, so she stopped her BP meds, and "I take a BC powder once in awhile."  Had indepth discussion with patient about risks of untreated HTN, urged her to discuss w/her physician and consider getting back onto anti-hypertensives. Patient has her meds at home, just hasn't been taking them.  BP today 200/96 patient is anxious, but that is high and would not likely be that high without underlying uncontrolled HTN   Neuro/Psych  PSYCHIATRIC DISORDERS  Depression    negative neurological ROS     GI/Hepatic negative GI ROS, Neg liver ROS,,,  Endo/Other  negative endocrine ROS    Renal/GU negative Renal ROS  negative genitourinary   Musculoskeletal  (+) Arthritis ,    Abdominal   Peds negative pediatric ROS (+)  Hematology negative hematology ROS (+)   Anesthesia Other Findings Depression Skin cancer Hyperlipemia  Hypertension Arthritis  Pre-diabetes    Reproductive/Obstetrics negative OB ROS                             Anesthesia Physical Anesthesia Plan  ASA: 3  Anesthesia Plan: MAC   Post-op Pain Management:    Induction: Intravenous  PONV Risk Score and Plan:   Airway Management Planned: Natural Airway and Nasal Cannula  Additional Equipment:   Intra-op Plan:   Post-operative Plan:   Informed Consent: I have reviewed the patients History and Physical, chart, labs and  discussed the procedure including the risks, benefits and alternatives for the proposed anesthesia with the patient or authorized representative who has indicated his/her understanding and acceptance.     Dental Advisory Given  Plan Discussed with: Anesthesiologist, CRNA and Surgeon  Anesthesia Plan Comments: (Patient consented for risks of anesthesia including but not limited to:  - adverse reactions to medications - damage to eyes, teeth, lips or other oral mucosa - nerve damage due to positioning  - sore throat or hoarseness - Damage to heart, brain, nerves, lungs, other parts of body or loss of life  Patient voiced understanding.)       Anesthesia Quick Evaluation

## 2023-02-23 NOTE — Discharge Instructions (Signed)

## 2023-02-24 ENCOUNTER — Encounter
Admission: RE | Disposition: A | Discharge status: Home / Self Care | Payer: Self-pay | Source: Home / Self Care | Attending: Ophthalmology

## 2023-02-24 ENCOUNTER — Ambulatory Visit: Payer: Medicare (Managed Care) | Admitting: Anesthesiology

## 2023-02-24 ENCOUNTER — Encounter: Payer: Self-pay | Admitting: Ophthalmology

## 2023-02-24 ENCOUNTER — Ambulatory Visit
Admission: RE | Admit: 2023-02-24 | Discharge: 2023-02-24 | Disposition: A | Discharge status: Home / Self Care | Payer: Medicare (Managed Care) | Attending: Ophthalmology | Admitting: Ophthalmology

## 2023-02-24 ENCOUNTER — Other Ambulatory Visit: Payer: Self-pay

## 2023-02-24 DIAGNOSIS — I10 Essential (primary) hypertension: Secondary | ICD-10-CM | POA: Insufficient documentation

## 2023-02-24 DIAGNOSIS — F32A Depression, unspecified: Secondary | ICD-10-CM | POA: Insufficient documentation

## 2023-02-24 DIAGNOSIS — H2511 Age-related nuclear cataract, right eye: Secondary | ICD-10-CM | POA: Insufficient documentation

## 2023-02-24 HISTORY — PX: CATARACT EXTRACTION W/PHACO: SHX586

## 2023-02-24 HISTORY — DX: Unspecified osteoarthritis, unspecified site: M19.90

## 2023-02-24 HISTORY — DX: Prediabetes: R73.03

## 2023-02-24 SURGERY — PHACOEMULSIFICATION, CATARACT, WITH IOL INSERTION
Anesthesia: Monitor Anesthesia Care | Site: Eye | Laterality: Right

## 2023-02-24 MED ORDER — LACTATED RINGERS IV SOLN: INTRAVENOUS | Status: DC

## 2023-02-24 MED ORDER — SIGHTPATH DOSE#1 BSS IO SOLN
INTRAOCULAR | Status: DC | PRN
  Administered 2023-02-24: 1 mL via INTRAMUSCULAR

## 2023-02-24 MED ORDER — ARMC OPHTHALMIC DILATING DROPS
1.0000 | OPHTHALMIC | Status: DC | PRN
  Administered 2023-02-24 (×3): 1 via OPHTHALMIC

## 2023-02-24 MED ORDER — SIGHTPATH DOSE#1 BSS IO SOLN
INTRAOCULAR | Status: DC | PRN
  Administered 2023-02-24: 54 mL via OPHTHALMIC

## 2023-02-24 MED ORDER — BRIMONIDINE TARTRATE-TIMOLOL 0.2-0.5 % OP SOLN
OPHTHALMIC | Status: DC | PRN
  Administered 2023-02-24: 1 [drp] via OPHTHALMIC

## 2023-02-24 MED ORDER — SIGHTPATH DOSE#1 BSS IO SOLN
INTRAOCULAR | Status: DC | PRN
  Administered 2023-02-24: 15 mL

## 2023-02-24 MED ORDER — MOXIFLOXACIN HCL 0.5 % OP SOLN
OPHTHALMIC | Status: DC | PRN
  Administered 2023-02-24: .2 mL via OPHTHALMIC

## 2023-02-24 MED ORDER — MIDAZOLAM HCL 2 MG/2ML IJ SOLN
INTRAMUSCULAR | Status: DC | PRN
  Administered 2023-02-24: 2 mg via INTRAVENOUS

## 2023-02-24 MED ORDER — TETRACAINE HCL 0.5 % OP SOLN
1.0000 [drp] | OPHTHALMIC | Status: DC | PRN
  Administered 2023-02-24 (×3): 1 [drp] via OPHTHALMIC

## 2023-02-24 MED ORDER — SIGHTPATH DOSE#1 NA CHONDROIT SULF-NA HYALURON 40-17 MG/ML IO SOLN
INTRAOCULAR | Status: DC | PRN
  Administered 2023-02-24: 1 mL via INTRAOCULAR

## 2023-02-24 SURGICAL SUPPLY — 11 items
ANGLE REVERSE CUT SHRT 25GA (CUTTER) ×1
CATARACT SUITE SIGHTPATH (MISCELLANEOUS) ×1 IMPLANT
CYSTOTOME ANGL RVRS SHRT 25G (CUTTER) ×1 IMPLANT
CYSTOTOME ANGL RVRS SHRT 25GA (CUTTER) ×1 IMPLANT
FEE CATARACT SUITE SIGHTPATH (MISCELLANEOUS) ×1 IMPLANT
GLOVE BIOGEL PI IND STRL 8 (GLOVE) ×1 IMPLANT
GLOVE SURG ENC TEXT LTX SZ8 (GLOVE) ×1 IMPLANT
LENS IOL TECNIS EYHANCE 19.0 (Intraocular Lens) IMPLANT
NDL FILTER BLUNT 18X1 1/2 (NEEDLE) ×1 IMPLANT
NEEDLE FILTER BLUNT 18X1 1/2 (NEEDLE) ×1 IMPLANT
SYR 3ML LL SCALE MARK (SYRINGE) ×1 IMPLANT

## 2023-02-24 NOTE — Op Note (Signed)
PREOPERATIVE DIAGNOSIS:  Nuclear sclerotic cataract of the right eye.   POSTOPERATIVE DIAGNOSIS:  H25.11 Cataract   OPERATIVE PROCEDURE:ORPROCALL@   SURGEON:  Galen Manila, MD.   ANESTHESIA:  Anesthesiologist: Marisue Humble, MD CRNA: Domenic Moras, CRNA  1.      Managed anesthesia care. 2.      0.4ml of Shugarcaine was instilled in the eye following the paracentesis.   COMPLICATIONS:  None.   TECHNIQUE:   Stop and chop   DESCRIPTION OF PROCEDURE:  The patient was examined and consented in the preoperative holding area where the aforementioned topical anesthesia was applied to the right eye and then brought back to the Operating Room where the right eye was prepped and draped in the usual sterile ophthalmic fashion and a lid speculum was placed. A paracentesis was created with the side port blade and the anterior chamber was filled with viscoelastic. A near clear corneal incision was performed with the steel keratome. A continuous curvilinear capsulorrhexis was performed with a cystotome followed by the capsulorrhexis forceps. Hydrodissection and hydrodelineation were carried out with BSS on a blunt cannula. The lens was removed in a stop and chop  technique and the remaining cortical material was removed with the irrigation-aspiration handpiece. The capsular bag was inflated with viscoelastic and the Technis [id number]  lens was placed in the capsular bag without complication. The remaining viscoelastic was removed from the eye with the irrigation-aspiration handpiece. The wounds were hydrated. The anterior chamber was flushed with BSS and the eye was inflated to physiologic pressure. 0.3ml of Vigamox was placed in the anterior chamber. The wounds were found to be water tight. The eye was dressed with Combigan. The patient was given protective glasses to wear throughout the day and a shield with which to sleep tonight. The patient was also given drops with which to begin a drop regimen today  and will follow-up with me in one day. Implant Name Type Inv. Item Serial No. Manufacturer Lot No. LRB No. Used Action  LENS IOL TECNIS EYHANCE 19.0 - [id number] Intraocular Lens LENS IOL TECNIS EYHANCE 19.0 [id number] [employer]  Right 1 Implanted   Procedure(s): CATARACT EXTRACTION PHACO AND INTRAOCULAR LENS PLACEMENT (IOC) RIGHT  5.08  00:46.1 (Right)  Electronically signed: Galen Manila 02/24/2023 9:43 AM

## 2023-02-24 NOTE — Transfer of Care (Signed)
Immediate Anesthesia Transfer of Care Note  Patient: Kelsey Arroyo  Procedure(s) Performed: CATARACT EXTRACTION PHACO AND INTRAOCULAR LENS PLACEMENT (IOC) RIGHT  5.08  00:46.1 (Right: Eye)  Patient Location: PACU  Anesthesia Type: MAC  Level of Consciousness: awake, alert  and patient cooperative  Airway and Oxygen Therapy: Patient Spontanous Breathing and Patient connected to supplemental oxygen  Post-op Assessment: Post-op Vital signs reviewed, Patient's Cardiovascular Status Stable, Respiratory Function Stable, Patent Airway and No signs of Nausea or vomiting  Post-op Vital Signs: Reviewed and stable  Complications: No notable events documented.

## 2023-02-24 NOTE — Anesthesia Postprocedure Evaluation (Signed)
Anesthesia Post Note  Patient: Kelsey Arroyo  Procedure(s) Performed: CATARACT EXTRACTION PHACO AND INTRAOCULAR LENS PLACEMENT (IOC) RIGHT  5.08  00:46.1 (Right: Eye)  Patient location during evaluation: PACU Anesthesia Type: MAC Level of consciousness: awake and alert Pain management: pain level controlled Vital Signs Assessment: post-procedure vital signs reviewed and stable Respiratory status: spontaneous breathing, nonlabored ventilation, respiratory function stable and patient connected to nasal cannula oxygen Cardiovascular status: stable and blood pressure returned to baseline Postop Assessment: no apparent nausea or vomiting Anesthetic complications: no   No notable events documented.   Last Vitals:  Vitals:   02/24/23 0945 02/24/23 0950  BP: (!) 148/90 (!) 147/93  Pulse: 73 73  Resp: 11 13  Temp: 36.5 C 36.5 C  SpO2: 100% 94%    Last Pain:  Vitals:   02/24/23 0950  TempSrc:   PainSc: 0-No pain                 Marisue Humble

## 2023-02-24 NOTE — H&P (Signed)
[facility]   Primary Care Physician:  Jerl Mina, MD Ophthalmologist: Dr. Druscilla Brownie  Pre-Procedure History & Physical: HPI:  Kelsey Arroyo is a 67 y.o. female here for cataract surgery.   Past Medical History:  Diagnosis Date   Arthritis    Depression    Hyperlipemia    Hypertension    Pre-diabetes    Skin cancer     Past Surgical History:  Procedure Laterality Date   CESAREAN SECTION     x2    Prior to Admission medications   Medication Sig Start Date End Date Taking? Authorizing Provider  Aspirin-Salicylamide-Caffeine (ARTHRITIS STRENGTH BC POWDER PO) Take by mouth in the morning and at bedtime.   Yes [provider]  buPROPion (WELLBUTRIN XL) 150 MG 24 hr tablet Take 150 mg by mouth daily. Patient not taking: Reported on 02/19/2023    [provider]  FLUoxetine (PROZAC) 20 MG capsule Take 1 capsule (20 mg total) by mouth daily. Patient not taking: Reported on 02/19/2023 06/05/22 08/04/22  Neysa Hotter, MD  simvastatin (ZOCOR) 10 MG tablet Take 10 mg by mouth daily. Patient not taking: Reported on 02/19/2023    [provider]  triamterene-hydrochlorothiazide (MAXZIDE-25) 37.5-25 MG tablet Take 1 tablet by mouth daily. Patient not taking: Reported on 02/19/2023    [provider]    Allergies as of 02/11/2023   (No Known Allergies)    Family History  Problem Relation Age of Onset   Cancer Maternal Grandmother     Social History   Socioeconomic History   Marital status: Divorced    Spouse name: Not on file   Number of children: 4   Years of education: Not on file   Highest education level: High school graduate  Occupational History   Not on file  Tobacco Use   Smoking status: Never   Smokeless tobacco: Never  Vaping Use   Vaping Use: Never used  Substance and Sexual Activity   Alcohol use: No   Drug use: No   Sexual activity: Not Currently  Other Topics Concern   Not on file  Social History Narrative    Not on file   Social Determinants of Health   Financial Resource Strain: Not on file  Food Insecurity: Not on file  Transportation Needs: Not on file  Physical Activity: Not on file  Stress: Not on file  Social Connections: Not on file  Intimate Partner Violence: Not on file    Review of Systems: See HPI, otherwise negative ROS  Physical Exam: BP (!) 200/96   Pulse 69   Temp 97.8 F (36.6 C) (Temporal)   Resp 18   Ht 4' 11.02" (1.499 m)   Wt 68.3 kg   SpO2 99%   BMI 30.38 kg/m  General:   Alert, cooperative in NAD Head:  Normocephalic and atraumatic. Respiratory:  Normal work of breathing. Cardiovascular:  RRR  Impression/Plan: Kelsey Arroyo is here for cataract surgery.  Risks, benefits, limitations, and alternatives regarding cataract surgery have been reviewed with the patient.  Questions have been answered.  All parties agreeable.   Galen Manila, MD  02/24/2023, 9:14 AM

## 2023-02-25 ENCOUNTER — Encounter: Payer: Self-pay | Admitting: Ophthalmology

## 2023-03-04 ENCOUNTER — Encounter: Payer: Self-pay | Admitting: Ophthalmology

## 2023-03-04 NOTE — Anesthesia Preprocedure Evaluation (Addendum)
Anesthesia Evaluation  Patient identified by MRN, date of birth, ID band Patient awake    Reviewed: Allergy & Precautions, NPO status , Patient's Chart, lab work & pertinent test results  Airway Mallampati: III  TM Distance: <3 FB Neck ROM: Full  Mouth opening: Limited Mouth Opening  Dental no notable dental hx. (+) Poor Dentition   Pulmonary    Pulmonary exam normal breath sounds clear to auscultation       Cardiovascular hypertension, Normal cardiovascular exam Rhythm:Regular Rate:Normal     Neuro/Psych    GI/Hepatic   Endo/Other    Renal/GU      Musculoskeletal   Abdominal   Peds  Hematology   Anesthesia Other Findings Depression  Skin cancer Hyperlipemia  Hypertension Arthritis  Pre-diabetes    Reproductive/Obstetrics                              Anesthesia Physical Anesthesia Plan  ASA: 3  Anesthesia Plan: MAC   Post-op Pain Management:    Induction: Intravenous  PONV Risk Score and Plan:   Airway Management Planned: Natural Airway and Nasal Cannula  Additional Equipment:   Intra-op Plan:   Post-operative Plan:   Informed Consent: I have reviewed the patients History and Physical, chart, labs and discussed the procedure including the risks, benefits and alternatives for the proposed anesthesia with the patient or authorized representative who has indicated his/her understanding and acceptance.     Dental Advisory Given  Plan Discussed with: Anesthesiologist, CRNA and Surgeon  Anesthesia Plan Comments: (Patient consented for risks of anesthesia including but not limited to:  - adverse reactions to medications - damage to eyes, teeth, lips or other oral mucosa - nerve damage due to positioning  - sore throat or hoarseness - Damage to heart, brain, nerves, lungs, other parts of body or loss of life  Patient voiced understanding.)         Anesthesia  Quick Evaluation

## 2023-03-05 NOTE — Discharge Instructions (Signed)

## 2023-03-10 ENCOUNTER — Encounter: Payer: Self-pay | Admitting: Ophthalmology

## 2023-03-10 ENCOUNTER — Ambulatory Visit: Payer: Medicare (Managed Care) | Admitting: Anesthesiology

## 2023-03-10 ENCOUNTER — Ambulatory Visit
Admission: RE | Admit: 2023-03-10 | Discharge: 2023-03-10 | Disposition: A | Discharge status: Home / Self Care | Payer: Medicare (Managed Care) | Attending: Ophthalmology | Admitting: Ophthalmology

## 2023-03-10 ENCOUNTER — Encounter
Admission: RE | Disposition: A | Discharge status: Home / Self Care | Payer: Self-pay | Source: Home / Self Care | Attending: Ophthalmology

## 2023-03-10 ENCOUNTER — Other Ambulatory Visit: Payer: Self-pay

## 2023-03-10 DIAGNOSIS — H2512 Age-related nuclear cataract, left eye: Secondary | ICD-10-CM | POA: Diagnosis present

## 2023-03-10 DIAGNOSIS — I10 Essential (primary) hypertension: Secondary | ICD-10-CM | POA: Insufficient documentation

## 2023-03-10 HISTORY — PX: CATARACT EXTRACTION W/PHACO: SHX586

## 2023-03-10 SURGERY — PHACOEMULSIFICATION, CATARACT, WITH IOL INSERTION
Anesthesia: Monitor Anesthesia Care | Site: Eye | Laterality: Left

## 2023-03-10 MED ORDER — FENTANYL CITRATE (PF) 100 MCG/2ML IJ SOLN
INTRAMUSCULAR | Status: DC | PRN
  Administered 2023-03-10: 50 ug via INTRAVENOUS

## 2023-03-10 MED ORDER — SIGHTPATH DOSE#1 NA CHONDROIT SULF-NA HYALURON 40-17 MG/ML IO SOLN
INTRAOCULAR | Status: DC | PRN
  Administered 2023-03-10: 1 mL via INTRAOCULAR

## 2023-03-10 MED ORDER — SIGHTPATH DOSE#1 BSS IO SOLN
INTRAOCULAR | Status: DC | PRN
  Administered 2023-03-10: 1 mL via INTRAMUSCULAR

## 2023-03-10 MED ORDER — BRIMONIDINE TARTRATE-TIMOLOL 0.2-0.5 % OP SOLN
OPHTHALMIC | Status: DC | PRN
  Administered 2023-03-10: 1 [drp] via OPHTHALMIC

## 2023-03-10 MED ORDER — MOXIFLOXACIN HCL 0.5 % OP SOLN
OPHTHALMIC | Status: DC | PRN
  Administered 2023-03-10: .2 mL via OPHTHALMIC

## 2023-03-10 MED ORDER — LACTATED RINGERS IV SOLN: INTRAVENOUS | Status: DC

## 2023-03-10 MED ORDER — MIDAZOLAM HCL 2 MG/2ML IJ SOLN
INTRAMUSCULAR | Status: DC | PRN
  Administered 2023-03-10: 2 mg via INTRAVENOUS

## 2023-03-10 MED ORDER — SIGHTPATH DOSE#1 BSS IO SOLN
INTRAOCULAR | Status: DC | PRN
  Administered 2023-03-10: 51 mL via OPHTHALMIC

## 2023-03-10 MED ORDER — ARMC OPHTHALMIC DILATING DROPS
1.0000 | OPHTHALMIC | Status: DC | PRN
  Administered 2023-03-10 (×3): 1 via OPHTHALMIC

## 2023-03-10 MED ORDER — TETRACAINE HCL 0.5 % OP SOLN
1.0000 [drp] | OPHTHALMIC | Status: DC | PRN
  Administered 2023-03-10 (×3): 1 [drp] via OPHTHALMIC

## 2023-03-10 MED ORDER — SIGHTPATH DOSE#1 BSS IO SOLN
INTRAOCULAR | Status: DC | PRN
  Administered 2023-03-10: 15 mL

## 2023-03-10 SURGICAL SUPPLY — 11 items
ANGLE REVERSE CUT SHRT 25GA (CUTTER) ×1
CATARACT SUITE SIGHTPATH (MISCELLANEOUS) ×1 IMPLANT
CYSTOTOME ANGL RVRS SHRT 25G (CUTTER) ×1 IMPLANT
CYSTOTOME ANGL RVRS SHRT 25GA (CUTTER) ×1 IMPLANT
FEE CATARACT SUITE SIGHTPATH (MISCELLANEOUS) ×1 IMPLANT
GLOVE BIOGEL PI IND STRL 8 (GLOVE) ×1 IMPLANT
GLOVE SURG ENC TEXT LTX SZ8 (GLOVE) ×1 IMPLANT
LENS IOL TECNIS EYHANCE 19.0 (Intraocular Lens) IMPLANT
NDL FILTER BLUNT 18X1 1/2 (NEEDLE) ×1 IMPLANT
NEEDLE FILTER BLUNT 18X1 1/2 (NEEDLE) ×1 IMPLANT
SYR 3ML LL SCALE MARK (SYRINGE) ×1 IMPLANT

## 2023-03-10 NOTE — Anesthesia Postprocedure Evaluation (Signed)
Anesthesia Post Note  Patient: Kelsey Arroyo  Procedure(s) Performed: CATARACT EXTRACTION PHACO AND INTRAOCULAR LENS PLACEMENT (IOC) LEFT  5.63  00:35.9 (Left: Eye)  Patient location during evaluation: PACU Anesthesia Type: MAC Level of consciousness: awake and alert Pain management: pain level controlled Vital Signs Assessment: post-procedure vital signs reviewed and stable Respiratory status: spontaneous breathing, nonlabored ventilation, respiratory function stable and patient connected to nasal cannula oxygen Cardiovascular status: stable and blood pressure returned to baseline Postop Assessment: no apparent nausea or vomiting Anesthetic complications: no   No notable events documented.   Last Vitals:  Vitals:   03/10/23 1242 03/10/23 1247  BP: (!) 137/98 (!) 150/99  Pulse: 73 82  Resp: 10 14  Temp: (!) 36.3 C (!) 36.3 C  SpO2: 93% 94%    Last Pain:  Vitals:   03/10/23 1247  TempSrc:   PainSc: 0-No pain                 Yousef Huge C Analeya Luallen

## 2023-03-10 NOTE — H&P (Signed)
Kelsey Arroyo   Primary Care Physician:  Jerl Mina, MD Ophthalmologist: Dr. Druscilla Brownie  Pre-Procedure History & Physical: HPI:  Kelsey Arroyo is a 67 y.o. female here for cataract surgery.   Past Medical History:  Diagnosis Date   Arthritis    Depression    Hyperlipemia    Hypertension    Pre-diabetes    Skin cancer     Past Surgical History:  Procedure Laterality Date   CATARACT EXTRACTION W/PHACO Right 02/24/2023   Procedure: CATARACT EXTRACTION PHACO AND INTRAOCULAR LENS PLACEMENT (IOC) RIGHT  5.08  00:46.1;  Surgeon: Galen Manila, MD;  Location: [facility];  Service: Ophthalmology;  Laterality: Right;   CESAREAN SECTION     x2    Prior to Admission medications   Medication Sig Start Date End Date Taking? Authorizing Provider  Aspirin-Salicylamide-Caffeine (ARTHRITIS STRENGTH BC POWDER PO) Take by mouth in the morning and at bedtime.   Yes [provider]  buPROPion (WELLBUTRIN XL) 150 MG 24 hr tablet Take 150 mg by mouth daily. Patient not taking: Reported on 02/19/2023    [provider]  FLUoxetine (PROZAC) 20 MG capsule Take 1 capsule (20 mg total) by mouth daily. Patient not taking: Reported on 02/19/2023 06/05/22 08/04/22  Neysa Hotter, MD  simvastatin (ZOCOR) 10 MG tablet Take 10 mg by mouth daily. Patient not taking: Reported on 02/19/2023    [provider]  triamterene-hydrochlorothiazide (MAXZIDE-25) 37.5-25 MG tablet Take 1 tablet by mouth daily. Patient not taking: Reported on 02/19/2023    [provider]    Allergies as of 02/11/2023   (No Known Allergies)    Family History  Problem Relation Age of Onset   Cancer Maternal Grandmother     Social History   Socioeconomic History   Marital status: Divorced    Spouse name: Not on file   Number of children: 4   Years of education: Not on file   Highest education level: High school graduate  Occupational History   Not on file  Tobacco Use    Smoking status: Never   Smokeless tobacco: Never  Vaping Use   Vaping Use: Never used  Substance and Sexual Activity   Alcohol use: No   Drug use: No   Sexual activity: Not Currently  Other Topics Concern   Not on file  Social History Narrative   Not on file   Social Determinants of Health   Financial Resource Strain: Not on file  Food Insecurity: Not on file  Transportation Needs: Not on file  Physical Activity: Not on file  Stress: Not on file  Social Connections: Not on file  Intimate Partner Violence: Not on file    Review of Systems: See HPI, otherwise negative ROS  Physical Exam: BP (!) 178/96   Temp 97.8 F (36.6 C) (Temporal)   Resp 14   Ht 4' 11.02" (1.499 m)   Wt 62.1 kg   SpO2 94%   BMI 27.62 kg/m  General:   Alert, cooperative in NAD Head:  Normocephalic and atraumatic. Respiratory:  Normal work of breathing. Cardiovascular:  RRR  Impression/Plan: Kelsey Arroyo is here for cataract surgery.  Risks, benefits, limitations, and alternatives regarding cataract surgery have been reviewed with the patient.  Questions have been answered.  All parties agreeable.   Galen Manila, MD  03/10/2023, 12:18 PM

## 2023-03-10 NOTE — Op Note (Signed)
PREOPERATIVE DIAGNOSIS:  Nuclear sclerotic cataract of the left eye.   POSTOPERATIVE DIAGNOSIS:  Nuclear sclerotic cataract of the left eye.   OPERATIVE PROCEDURE:ORPROCALL@   SURGEON:  Galen Manila, MD.   ANESTHESIA:  Anesthesiologist: Marisue Humble, MD CRNA: Emeterio Reeve, CRNA; Genia Del, CRNA  1.      Managed anesthesia care. 2.     0.65ml of Shugarcaine was instilled following the paracentesis   COMPLICATIONS:  None.   TECHNIQUE:   Stop and chop   DESCRIPTION OF PROCEDURE:  The patient was examined and consented in the preoperative holding area where the aforementioned topical anesthesia was applied to the left eye and then brought back to the Operating Room where the left eye was prepped and draped in the usual sterile ophthalmic fashion and a lid speculum was placed. A paracentesis was created with the side port blade and the anterior chamber was filled with viscoelastic. A near clear corneal incision was performed with the steel keratome. A continuous curvilinear capsulorrhexis was performed with a cystotome followed by the capsulorrhexis forceps. Hydrodissection and hydrodelineation were carried out with BSS on a blunt cannula. The lens was removed in a stop and chop  technique and the remaining cortical material was removed with the irrigation-aspiration handpiece. The capsular bag was inflated with viscoelastic and the Technis ZCB00 lens was placed in the capsular bag without complication. The remaining viscoelastic was removed from the eye with the irrigation-aspiration handpiece. The wounds were hydrated. The anterior chamber was flushed with BSS and the eye was inflated to physiologic pressure. 0.34ml Vigamox was placed in the anterior chamber. The wounds were found to be water tight. The eye was dressed with Combigan. The patient was given protective glasses to wear throughout the day and a shield with which to sleep tonight. The patient was also given drops with  which to begin a drop regimen today and will follow-up with me in one day. Implant Name Type Inv. Item Serial No. Manufacturer Lot No. LRB No. Used Action  LENS IOL TECNIS EYHANCE 19.0 - [id number] Intraocular Lens LENS IOL TECNIS EYHANCE 19.0 [id number] [employer]  Left 1 Implanted    Procedure(s): CATARACT EXTRACTION PHACO AND INTRAOCULAR LENS PLACEMENT (IOC) LEFT  5.63  00:35.9 (Left)  Electronically signed: Galen Manila 03/10/2023 12:40 PM

## 2023-03-10 NOTE — Transfer of Care (Signed)
Immediate Anesthesia Transfer of Care Note  Patient: Kelsey Arroyo  Procedure(s) Performed: CATARACT EXTRACTION PHACO AND INTRAOCULAR LENS PLACEMENT (IOC) LEFT  5.63  00:35.9 (Left: Eye)  Patient Location: PACU  Anesthesia Type: MAC  Level of Consciousness: awake, alert  and patient cooperative  Airway and Oxygen Therapy: Patient Spontanous Breathing and Patient connected to supplemental oxygen  Post-op Assessment: Post-op Vital signs reviewed, Patient's Cardiovascular Status Stable, Respiratory Function Stable, Patent Airway and No signs of Nausea or vomiting  Post-op Vital Signs: Reviewed and stable  Complications: No notable events documented.

## 2023-03-11 ENCOUNTER — Encounter: Payer: Self-pay | Admitting: Ophthalmology

## 2023-07-15 ENCOUNTER — Ambulatory Visit
Admission: RE | Admit: 2023-07-15 | Discharge: 2023-07-15 | Disposition: A | Discharge status: Home / Self Care | Payer: Medicare (Managed Care) | Source: Ambulatory Visit | Attending: Family Medicine | Admitting: Family Medicine

## 2023-07-15 DIAGNOSIS — Z1231 Encounter for screening mammogram for malignant neoplasm of breast: Secondary | ICD-10-CM | POA: Insufficient documentation
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
## 2024-02-18 ENCOUNTER — Ambulatory Visit: Payer: Medicare (Managed Care) | Admitting: Psychiatry

## 2024-02-18 ENCOUNTER — Other Ambulatory Visit: Payer: Self-pay

## 2024-02-18 ENCOUNTER — Encounter: Payer: Self-pay | Admitting: Psychiatry

## 2024-02-18 VITALS — BP 114/77 | HR 105 | Temp 97.2°F | Ht 59.02 in | Wt 146.8 lb

## 2024-02-18 DIAGNOSIS — F4310 Post-traumatic stress disorder, unspecified: Secondary | ICD-10-CM | POA: Diagnosis not present

## 2024-02-18 DIAGNOSIS — F331 Major depressive disorder, recurrent, moderate: Secondary | ICD-10-CM

## 2024-02-18 NOTE — Patient Instructions (Signed)
 Increase fluoxetine  40 mg daily Continue bupropion 150 mg daily  Next appointment: in mid June (call [phone] to make an appointment)

## 2024-03-22 ENCOUNTER — Encounter: Payer: Self-pay | Admitting: *Deleted

## 2024-04-08 ENCOUNTER — Ambulatory Visit: Admission: RE | Admit: 2024-04-08 | Payer: Medicare (Managed Care) | Source: Home / Self Care

## 2024-04-08 HISTORY — DX: Deficiency of other specified B group vitamins: E53.8

## 2024-04-08 HISTORY — DX: Obsessive-compulsive disorder, unspecified: F42.9

## 2024-04-08 HISTORY — DX: Acne, unspecified: L70.9

## 2024-04-08 HISTORY — DX: Major depressive disorder, recurrent severe without psychotic features: F33.2

## 2024-04-08 HISTORY — DX: Vitamin D deficiency, unspecified: E55.9

## 2024-04-08 HISTORY — DX: Type 2 diabetes mellitus without complications: E11.9

## 2024-04-08 HISTORY — DX: Mild cognitive impairment of uncertain or unknown etiology: G31.84

## 2024-04-08 HISTORY — DX: Gastro-esophageal reflux disease without esophagitis: K21.9

## 2024-04-08 HISTORY — DX: Migraine, unspecified, not intractable, without status migrainosus: G43.909

## 2024-04-08 HISTORY — DX: Insomnia, unspecified: G47.00

## 2024-04-08 SURGERY — COLONOSCOPY
Anesthesia: General

## 2024-04-21 ENCOUNTER — Ambulatory Visit: Payer: Medicare (Managed Care) | Admitting: Clinical

## 2024-05-26 ENCOUNTER — Ambulatory Visit (INDEPENDENT_AMBULATORY_CARE_PROVIDER_SITE_OTHER): Payer: Medicare (Managed Care) | Admitting: Clinical

## 2024-05-26 DIAGNOSIS — F4320 Adjustment disorder, unspecified: Secondary | ICD-10-CM
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
## 2024-05-26 NOTE — Progress Notes (Signed)
   Darice Seats, LCSW

## 2024-06-30 ENCOUNTER — Ambulatory Visit (INDEPENDENT_AMBULATORY_CARE_PROVIDER_SITE_OTHER): Payer: Medicare (Managed Care) | Admitting: Clinical

## 2024-06-30 DIAGNOSIS — F4320 Adjustment disorder, unspecified: Secondary | ICD-10-CM

## 2024-06-30 NOTE — Progress Notes (Signed)
 Kelsey Arroyo, MRN: [id number]    Date: 06/30/24  Time Spent: 10:38  am - 11:28 am : 50 Minutes  Treatment Type: Individual Therapy.  Reported Symptoms: intrusive thoughts  Mental Status Exam: Appearance:  Neat and Well Groomed     Behavior: Appropriate  Motor: Normal  Speech/Language:  Clear and Coherent and Normal Rate  Affect: Appropriate  Mood: normal  Thought process: normal  Thought content:   WNL  Sensory/Perceptual disturbances:   WNL  Orientation: oriented to person, place, and situation  Attention: Good  Concentration: Good  Memory: WNL  Fund of knowledge:  Good  Insight:   Good  Judgment:  Good  Impulse Control: Good   Risk Assessment: Danger to Self:  No Patient denied current suicidal ideation  Self-injurious Behavior: No Danger to Others: No Patient denied current homicidal ideation Duty to Warn:no Physical Aggression / Violence:No  Access to Firearms a concern: No  Gang Involvement:No   Subjective:  Patient stated, I'm good and reported no changes since last session. Patient stated, it just continually gets better, I'm not stressed out in response to patient's mood since last session. Patient stated, if it would help I can do that in reference to participation in therapy. Patient stated, I don't know in response to potential goals for therapy. Patient stated, I like being in my apartment with my door locked, I'm good. Patient reported patient goes out to eat with patient's sisters on Thursdays and reported enjoying eating with sisters on Thursdays. Patient stated, I enjoy my apartment I've got everything I need. Patient stated, I would love to have just one good friend in the complex. Patient stated, I love where I live and stated, I control in reference to patient's living environment. Patient reported two previous abortions and stated, that stays on my mind.  Patient reported patient feels family members are judgmental of patient's previous decisions. Patient stated, Its always in my mind, I feel like I'm always judged from stuff in the past, I'm on my mind all the time.   Interventions: Motivational Interviewing. Clinician conducted session in person at clinician's office at [facility]. Reviewed events since last session and assessed for changes. Clinician reviewed diagnosis and treatment recommendations. Provided psycho education related to diagnosis and treatment. Clinician utilized motivational interviewing to explore potential goals for therapy.   Collaboration of Care: Other not required at this time  Diagnosis:  Adjustment disorder, unspecified type   Plan: Goals/target dates to be developed during follow up appointment on 07/28/24 due to patient experiencing difficulty identifying goals for therapy.                   Darice Seats, LCSW

## 2024-07-28 ENCOUNTER — Ambulatory Visit: Payer: Medicare (Managed Care) | Admitting: Clinical

## 2024-08-03 ENCOUNTER — Ambulatory Visit (INDEPENDENT_AMBULATORY_CARE_PROVIDER_SITE_OTHER): Payer: Medicare (Managed Care) | Admitting: Clinical

## 2024-08-03 DIAGNOSIS — F4320 Adjustment disorder, unspecified: Secondary | ICD-10-CM | POA: Diagnosis not present

## 2024-08-03 NOTE — Progress Notes (Signed)
 [facility] Counselor/Therapist Progress Note  Patient ID: Kelsey Arroyo, MRN: [id number]    Date: 08/03/24  Time Spent: 11:40  am - 12:26 pm : 46 Minutes  Treatment Type: Individual Therapy.  Reported Symptoms: ruminating thoughts  Mental Status Exam: Appearance:  Neat and Well Groomed     Behavior: Appropriate  Motor: Normal  Speech/Language:  Clear and Coherent and Normal Rate  Affect: Appropriate  Mood: normal  Thought process: normal  Thought content:   WNL  Sensory/Perceptual disturbances:   WNL  Orientation: oriented to person, place, time/date, and situation  Attention: Good  Concentration: Good  Memory: WNL  Fund of knowledge:  Good  Insight:   Good  Judgment:  Good  Impulse Control: Good   Risk Assessment: Danger to Self:  No Patient denied current suicidal ideation  Self-injurious Behavior: No Danger to Others: No Patient denied current homicidal ideation Duty to Warn:no Physical Aggression / Violence:No  Access to Firearms a concern: No  Gang Involvement:No   Subjective:  Patient stated, its good in response to events since last session and reported not changes since last session. Patient stated, fine, but pretty much I stay to my self in response to mood since last session. Patient reported feeling uncomfortable around several residents. Patient reported patient stays in patient's apartment due to patient's ability to control the environment in patient's apartment. Patient stated, everybody in my family knows it, I get judged, I know what they're thinking in reference to previous abortions. Patient stated, that is the main thing I dwell on in reference to previous abortions. Patient reported patient's daughter had a stillborn and patient stated, some times I think is god punishing her in reference to patient's decision to have an abortion. Patient stated, every night when I go to bed, every morning when I get it, there it is in  reference to thoughts about previous abortions. Patient stated, I would love to be able to be affiliated with a church or attend a church in response to goals for therapy.  Interventions: Motivational Interviewing. Clinician conducted session in person at clinician's office at [facility]. Reviewed events since last session and assessed for changes. Clinician utilized motivational interviewing to explore potential goals for therapy. Clinician utilized a task centered approach in collaboration with patient to develop goals for therapy. Patient participated in development of goals and agreed to goals for therapy.   Collaboration of Care: Other not required at this time  Diagnosis:  Adjustment disorder, unspecified type   Plan: Patient is to utilize [employer], thought re-framing, relaxation techniques, mindfulness, behavioral activation, and coping strategies to decrease symptoms associated with their diagnosis. Frequency: bi-weekly  Modality: individual     Long-term goal:   Decrease ruminating thoughts from 7 days per week to 0 to 1 day per week  Target Date: 08/03/25  Progress: established 08/03/24   Short-term goal:  Begin a healthy grieving process as it relates to previous losses Target Date: 08/03/25  Progress: established 08/03/24   Identify, challenge, and replace negative core beliefs, negative thought patterns, such as, assumptions and negative self talk that impact patient's thoughts and behaviors with positive thoughts, beliefs, and positive self talk per patient's report Target Date: 08/03/25  Progress: established 08/03/24   Practice mindfulness exercises and relaxation techniques daily to reduce frequency of ruminating thoughts Target Date: 08/03/25  Progress: established 08/03/24   Increase patient's participation in social opportunities from 1 day per week 3 to 4 days per week  Target Date: 08/03/25  Progress: established 08/03/24                  Darice Seats, LCSW

## 2024-08-24 ENCOUNTER — Other Ambulatory Visit: Payer: Self-pay | Admitting: Family Medicine

## 2024-08-24 DIAGNOSIS — Z1231 Encounter for screening mammogram for malignant neoplasm of breast: Secondary | ICD-10-CM

## 2024-09-07 ENCOUNTER — Ambulatory Visit: Payer: Medicare (Managed Care) | Admitting: Clinical

## 2024-09-07 DIAGNOSIS — F4320 Adjustment disorder, unspecified: Secondary | ICD-10-CM | POA: Diagnosis not present

## 2024-09-07 NOTE — Progress Notes (Signed)
   Darice Seats, LCSW

## 2024-09-07 NOTE — Progress Notes (Signed)
 [facility] Counselor/Therapist Progress Note  Patient ID: Kelsey Arroyo, MRN: [id number],    Date: 09/07/2024  Time Spent: 9:36am - 10:31am : 55 minutes   Treatment Type: Individual Therapy  Reported Symptoms: difficulty sleeping, napping during day  Mental Status Exam: Appearance:  Neat and Well Groomed     Behavior: Appropriate  Motor: Normal  Speech/Language:  Clear and Coherent and Normal Rate  Affect: Appropriate  Mood: normal  Thought process: normal  Thought content:   WNL  Sensory/Perceptual disturbances:   WNL  Orientation: oriented to person, place, time/date, and situation  Attention: Good  Concentration: Good  Memory: WNL  Fund of knowledge:  Good  Insight:   Good  Judgment:  Good  Impulse Control: Good   Risk Assessment: Danger to Self:  No Patient denied current suicidal ideation  Self-injurious Behavior: No Danger to Others: No Patient denied current homicidal ideation Duty to Warn:no Physical Aggression / Violence:No  Access to Firearms a concern: No  Gang Involvement:No   Subjective:  Patient stated, I'm still having issues with sleeping, but I nap all day, I have nothing to distract me. Patient stated, I have nothing else to do to entertain myself in reference to patient's activity level. Patient reported patient recently attended two activities at patient's apartment community. Patient reported the apartment community offers meals and bingo. Patient stated, I'm getting better at wanting to be around people. Patient reported seclusion, previous interactions with community members, and others' communication with patient are barriers to participation in community activities. Patient reported a bus provides transportation to and from the community patient resides in and patient reported uncertainty regarding coordinating transportation services offered to the community. Patient stated, I need the bus, I need to really get on board  with that and figure out what I need to do. Patient stated, I'm good, at 68 years I'm at the best place I've ever been.   Interventions: Cognitive Behavioral Therapy. Clinician conducted session in person at clinician's office at [facility]. Reviewed events since last session and assessed for changes. Discussed patient's concerns related to sleep and patient's activity level. Explored activities offered by apartment community. Explored and identified barriers to participation in activities. Provided psycho education related to sleep and healthy sleep hygiene. Provided psycho education related generating alternatives. Clinician requested for homework patient research transportation option offered to community patient resides in and obtain a calendar of activities for apartment community.    Collaboration of Care: Other not required at this time   Diagnosis:  Adjustment disorder, unspecified type     Plan: Patient is to utilize [employer], thought re-framing, relaxation techniques, mindfulness, behavioral activation, and coping strategies to decrease symptoms associated with their diagnosis. Frequency: bi-weekly  Modality: individual      Long-term goal:   Decrease ruminating thoughts from 7 days per week to 0 to 1 day per week  Target Date: 08/03/25  Progress: progressing    Short-term goal:  Begin a healthy grieving process as it relates to previous losses Target Date: 08/03/25  Progress: progressing    Identify, challenge, and replace negative core beliefs, negative thought patterns, such as, assumptions and negative self talk that impact patient's thoughts and behaviors with positive thoughts, beliefs, and positive self talk per patient's report Target Date: 08/03/25  Progress: progressing    Practice mindfulness exercises and relaxation techniques daily to reduce frequency of ruminating thoughts Target Date: 08/03/25  Progress: progressing     Increase patient's participation in  social opportunities from 1 day per week 3 to 4 days per week Target Date: 08/03/25  Progress: established 08/03/24                         Darice Seats, LCSW

## 2024-09-28 ENCOUNTER — Inpatient Hospital Stay
Admission: RE | Admit: 2024-09-28 | Discharge: 2024-09-28 | Payer: Medicare (Managed Care) | Attending: Family Medicine | Admitting: Family Medicine

## 2024-09-28 DIAGNOSIS — Z1231 Encounter for screening mammogram for malignant neoplasm of breast: Secondary | ICD-10-CM | POA: Diagnosis present

## 2024-10-07 ENCOUNTER — Other Ambulatory Visit: Payer: Self-pay | Admitting: Family Medicine

## 2024-10-07 DIAGNOSIS — R928 Other abnormal and inconclusive findings on diagnostic imaging of breast: Secondary | ICD-10-CM

## 2024-10-12 ENCOUNTER — Inpatient Hospital Stay
Admission: RE | Admit: 2024-10-12 | Discharge: 2024-10-12 | Payer: Medicare (Managed Care) | Attending: Family Medicine | Admitting: Family Medicine

## 2024-10-12 DIAGNOSIS — R928 Other abnormal and inconclusive findings on diagnostic imaging of breast: Secondary | ICD-10-CM | POA: Diagnosis present

## 2024-10-17 ENCOUNTER — Other Ambulatory Visit: Payer: Self-pay | Admitting: Family Medicine

## 2024-10-17 DIAGNOSIS — R921 Mammographic calcification found on diagnostic imaging of breast: Secondary | ICD-10-CM

## 2024-10-26 ENCOUNTER — Ambulatory Visit: Payer: Medicare (Managed Care) | Admitting: Clinical

## 2024-10-26 DIAGNOSIS — F4320 Adjustment disorder, unspecified: Secondary | ICD-10-CM | POA: Diagnosis not present

## 2024-10-26 NOTE — Progress Notes (Signed)
 "  [facility] Counselor/Therapist Progress Note  Patient ID: Kelsey Arroyo, MRN: [id number],    Date: 10/26/2024  Time Spent: 10:42am - 11:23am : 41 minutes   Treatment Type: Individual Therapy  Reported Symptoms: none reported  Mental Status Exam: Appearance:  Neat and Well Groomed     Behavior: Appropriate  Motor: Normal  Speech/Language:  Clear and Coherent and Normal Rate  Affect: Appropriate  Mood: normal  Thought process: normal  Thought content:   WNL  Sensory/Perceptual disturbances:   WNL  Orientation: oriented to person, place, time/date, and situation  Attention: Good  Concentration: Good  Memory: WNL  Fund of knowledge:  Good  Insight:   Good  Judgment:  Good  Impulse Control: Good   Risk Assessment: Danger to Self:  No Patient denied current suicidal ideation  Self-injurious Behavior: No Danger to Others: No Patient denied current homicidal ideation Duty to Warn:no Physical Aggression / Violence:No  Access to Firearms a concern: No  Gang Involvement:No   Subjective: Patient stated, getting better all the time in response to events since last session and stated, just the whole situation. Patient stated, I've been going downstairs Monday through Friday in reference to eating lunch in community room at patient's apartment community. Patient stated, I've been getting better at it in reference to eating lunch in community room. Patient reported patient has participated in activities in the community room twice this week (once to eat lunch, once for a meeting). Patient stated, I'm getting more comfortable with being around the people. Patient stated, not a whole lot in reference to socializing when in community room. Patient stated, Its the best place I've lived in 68 years in reference to current living environment. Patient stated, better in reference to mood since last session and stated, I think going downstairs, it helps.  Patient  stated, Its about the best it has been , the whole situation in reference to patient's current environment. Patient stated, I'm good in response to current mood. Patient stated, still working on that in reference to patient's homework assignment. Patient reported two buses provide transportation for patient's community and reported buses provide transportation to restaurants and shops. Patient reported patient returns to patient's apartment after meals or meetings. Patient reported patient tries to avoid two individuals due to previous interactions.   Interventions: Cognitive Behavioral Therapy. Clinician conducted session in person at clinician's office at [facility]. Reviewed events since last session and assessed for changes. Clarified patient's initial statement. Discussed patient's interactions with others. Reviewed benefits of positive activities and socialization on mood. Reviewed homework. Explored opportunities offered by transportation providers to increase socialization. Challenged statements to assist patient in reframing thoughts related to socialization. Explored and identified barriers to participation in community activities and socialization. Discussed strategies to increase opportunities for socialization, such as, working on patient's puzzles in community room, asking sister to accompany patient on bus trip to [employer]. Discussed behavioral activation strategies to achieve goal of attending community events three times per week. Clinician requested for homework patient research transportation option offered to community patient resides in and obtain a calendar of activities for apartment community.    Collaboration of Care: Other not required at this time   Diagnosis:  Adjustment disorder, unspecified type     Plan: Patient is to utilize [employer], thought re-framing, relaxation techniques, mindfulness, behavioral activation, and coping strategies to  decrease symptoms associated with their diagnosis. Frequency: bi-weekly  Modality: individual      Long-term  goal:   Decrease ruminating thoughts from 7 days per week to 0 to 1 day per week  Target Date: 08/03/25  Progress: progressing    Short-term goal:  Begin a healthy grieving process as it relates to previous losses Target Date: 08/03/25  Progress: progressing    Identify, challenge, and replace negative core beliefs, negative thought patterns, such as, assumptions and negative self talk that impact patient's thoughts and behaviors with positive thoughts, beliefs, and positive self talk per patient's report Target Date: 08/03/25  Progress: progressing    Practice mindfulness exercises and relaxation techniques daily to reduce frequency of ruminating thoughts Target Date: 08/03/25  Progress: progressing    Increase patient's participation in social opportunities from 1 day per week 3 to 4 days per week Target Date: 08/03/25  Progress: progressing                   Darice Seats, LCSW    "

## 2024-10-26 NOTE — Progress Notes (Signed)
   Darice Seats, LCSW

## 2024-12-07 ENCOUNTER — Ambulatory Visit: Payer: Medicare (Managed Care) | Admitting: Clinical
